# Patient Record
Sex: Female | Born: 1995 | Race: Black or African American | Hispanic: No | Marital: Single | Smoking: Never smoker
Health system: Southern US, Community
[De-identification: ages and names within clinical notes are randomized; demographics above are authoritative.]

## PROBLEM LIST (undated history)

## (undated) ENCOUNTER — Inpatient Hospital Stay (HOSPITAL_COMMUNITY): Payer: Self-pay

## (undated) DIAGNOSIS — O24419 Gestational diabetes mellitus in pregnancy, unspecified control: Secondary | ICD-10-CM

## (undated) DIAGNOSIS — E559 Vitamin D deficiency, unspecified: Secondary | ICD-10-CM

## (undated) DIAGNOSIS — E785 Hyperlipidemia, unspecified: Secondary | ICD-10-CM

## (undated) DIAGNOSIS — D649 Anemia, unspecified: Secondary | ICD-10-CM

## (undated) HISTORY — PX: WISDOM TOOTH EXTRACTION: SHX21

## (undated) HISTORY — DX: Vitamin D deficiency, unspecified: E55.9

## (undated) HISTORY — DX: Gestational diabetes mellitus in pregnancy, unspecified control: O24.419

## (undated) HISTORY — DX: Hyperlipidemia, unspecified: E78.5

---
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
## 2016-01-13 ENCOUNTER — Ambulatory Visit (INDEPENDENT_AMBULATORY_CARE_PROVIDER_SITE_OTHER): Payer: Medicaid Other | Admitting: Family

## 2016-01-13 ENCOUNTER — Encounter: Payer: Self-pay | Admitting: Family

## 2016-01-13 VITALS — BP 125/67 | HR 93 | Wt 190.0 lb

## 2016-01-13 DIAGNOSIS — Z23 Encounter for immunization: Secondary | ICD-10-CM | POA: Diagnosis present

## 2016-01-13 DIAGNOSIS — R8271 Bacteriuria: Secondary | ICD-10-CM | POA: Insufficient documentation

## 2016-01-13 DIAGNOSIS — Z3493 Encounter for supervision of normal pregnancy, unspecified, third trimester: Secondary | ICD-10-CM | POA: Diagnosis not present

## 2016-01-13 DIAGNOSIS — Z3490 Encounter for supervision of normal pregnancy, unspecified, unspecified trimester: Secondary | ICD-10-CM | POA: Insufficient documentation

## 2016-01-13 LAB — POCT URINALYSIS DIP (DEVICE)
BILIRUBIN URINE: NEGATIVE
Glucose, UA: NEGATIVE mg/dL
KETONES UR: NEGATIVE mg/dL
Nitrite: NEGATIVE
Protein, ur: NEGATIVE mg/dL
SPECIFIC GRAVITY, URINE: 1.015 (ref 1.005–1.030)
Urobilinogen, UA: 0.2 mg/dL (ref 0.0–1.0)
pH: 7 (ref 5.0–8.0)

## 2016-01-13 NOTE — Progress Notes (Signed)
Flu vaccine given Urine: trace hgb, wbcs

## 2016-01-13 NOTE — Patient Instructions (Signed)

## 2016-01-13 NOTE — Progress Notes (Signed)
   PRENATAL VISIT NOTE  Subjective:  Christophe LouisJihan Knudtson is a 20 y.o. G1P0 at 7967w0d being seen today for ongoing prenatal care.  She is currently monitored for the following issues for this low-risk pregnancy and has Pain in joint, ankle and foot; Nevus, non-neoplastic; Supervision of normal pregnancy, antepartum; Supervision of normal pregnancy; and Bacteria in urine on her problem list.  Patient reports no complaints.  Contractions: Not present. Vag. Bleeding: None.  Movement: Present. Denies leaking of fluid.   The following portions of the patient's history were reviewed and updated as appropriate: allergies, current medications, past family history, past medical history, past social history, past surgical history and problem list. Problem list updated.  Objective:   Vitals:   01/13/16 1122  BP: 125/67  Pulse: 93  Weight: 190 lb (86.2 kg)    Fetal Status: Fetal Heart Rate (bpm): 148 Fundal Height: 23 cm Movement: Present     General:  Alert, oriented and cooperative. Patient is in no acute distress.  Skin: Skin is warm and dry. No rash noted.   Cardiovascular: Normal heart rate noted  Respiratory: Normal respiratory effort, no problems with respiration noted  Abdomen: Soft, gravid, appropriate for gestational age. Pain/Pressure: Present     Pelvic:  Cervical exam deferred        Extremities: Normal range of motion.  Edema: None  Mental Status: Normal mood and affect. Normal behavior. Normal judgment and thought content.   Urinalysis: Urine Protein: Negative Urine Glucose: Negative  Assessment and Plan:  Pregnancy: G1P0 at 1967w0d  1. Needs flu shot - Flu Vaccine QUAD 36+ mos IM (Fluarix, Quad PF)  2. Supervision of normal pregnancy, third trimester - Discussed ultrasound frequency (as clinically indicated)  3. Bacteria in urine - Culture, OB Urine  Preterm labor symptoms and general obstetric precautions including but not limited to vaginal bleeding, contractions, leaking of  fluid and fetal movement were reviewed in detail with the patient. Please refer to After Visit Summary for other counseling recommendations.  Return in 4 weeks (on 02/10/2016).  Eino FarberWalidah Kennith GainN Karim, CNM

## 2016-01-16 NOTE — Progress Notes (Deleted)
  [name] D.O. [facility] [address] [place], [place] [address] Phone: [phone] Subjective:    I'm seeing this patient by the request  of:  ETTEFAGH, KATE S, MD   CC: Shoulder pain  OZH:[id number]:Subjective  Sally Shannon is a 20 y.o. female coming in with complaint of shoulder pain. Patient's past medical history significant for pregnancy. Patient is 21 weeks and 5 days.     Past Medical History:  Diagnosis Date  . Anemia    No past surgical history on file. Social History   Social History  . Marital status: Single    Spouse name: N/A  . Number of children: N/A  . Years of education: N/A   Social History Main Topics  . Smoking status: Never Smoker  . Smokeless tobacco: Never Used  . Alcohol use No  . Drug use: No  . Sexual activity: Yes   Other Topics Concern  . Not on file   Social History Narrative  . No narrative on file   No Known Allergies Family History  Problem Relation Age of Onset  . Anemia Mother   . Depression Mother   . Diabetes Mother   . Anemia Maternal Grandmother     Past medical history, social, surgical and family history all reviewed in electronic medical record.  No pertanent information unless stated regarding to the chief complaint.   Review of Systems: No headache, visual changes, nausea, vomiting, diarrhea, constipation, dizziness, abdominal pain, skin rash, fevers, chills, night sweats, weight loss, swollen lymph nodes, body aches, joint swelling, muscle aches, chest pain, shortness of breath, mood changes.   Objective  Last menstrual period 08/19/2015.  General: No apparent distress alert and oriented x3 mood and affect normal, dressed appropriately.  HEENT: Pupils equal, extraocular movements intact  Respiratory: Patient's speak in full sentences and does not appear short of breath  Cardiovascular: No lower extremity edema, non tender, no erythema  Skin: Warm dry intact with no signs of infection or rash on extremities  or on axial skeleton.  Abdomen: Soft nontender  Neuro: Cranial nerves II through XII are intact, neurovascularly intact in all extremities with 2+ DTRs and 2+ pulses.  Lymph: No lymphadenopathy of posterior or anterior cervical chain or axillae bilaterally.  Gait normal with good balance and coordination.  MSK:  Non tender with full range of motion and good stability and symmetric strength and tone of shoulders, elbows, wrist, hip, knee and ankles bilaterally.     Impression and Recommendations:     This case required medical decision making of moderate complexity.      Note: This dictation was prepared with Dragon dictation along with smaller phrase technology. Any transcriptional errors that result from this process are unintentional.

## 2016-01-18 ENCOUNTER — Ambulatory Visit: Payer: Medicaid Other | Admitting: Family Medicine

## 2016-02-03 ENCOUNTER — Telehealth: Payer: Self-pay | Admitting: *Deleted

## 2016-02-03 NOTE — Telephone Encounter (Signed)
Patient called, left message. States rib pain on right side. It is very bad, what should she do, is this normal? [redacted] weeks pregnant. Please return her call

## 2016-02-05 NOTE — Telephone Encounter (Signed)
Attempted to call patient. There was no answer, phone rang several times, then stopped. No voice mail available.

## 2016-02-10 ENCOUNTER — Encounter: Payer: Self-pay | Admitting: Advanced Practice Midwife

## 2016-02-10 ENCOUNTER — Ambulatory Visit (INDEPENDENT_AMBULATORY_CARE_PROVIDER_SITE_OTHER): Payer: Medicaid Other | Admitting: Advanced Practice Midwife

## 2016-02-10 VITALS — BP 127/67 | HR 105 | Wt 196.6 lb

## 2016-02-10 DIAGNOSIS — N898 Other specified noninflammatory disorders of vagina: Secondary | ICD-10-CM

## 2016-02-10 DIAGNOSIS — B373 Candidiasis of vulva and vagina: Secondary | ICD-10-CM

## 2016-02-10 DIAGNOSIS — O26892 Other specified pregnancy related conditions, second trimester: Principal | ICD-10-CM

## 2016-02-10 DIAGNOSIS — B3731 Acute candidiasis of vulva and vagina: Secondary | ICD-10-CM | POA: Insufficient documentation

## 2016-02-10 DIAGNOSIS — O98812 Other maternal infectious and parasitic diseases complicating pregnancy, second trimester: Secondary | ICD-10-CM

## 2016-02-10 MED ORDER — TERCONAZOLE 0.4 % VA CREA: 1.0000 | TOPICAL_CREAM | Freq: Every day | VAGINAL | 0 refills | Status: DC

## 2016-02-10 NOTE — Progress Notes (Signed)
C/o vaginal itching, burning, pain with intercourse.

## 2016-02-10 NOTE — Patient Instructions (Signed)

## 2016-02-10 NOTE — Progress Notes (Signed)
   PRENATAL VISIT NOTE  Subjective:  Sally Shannon is a 20 y.o. G1P0 at 4110w0d being seen today for ongoing prenatal care.  She is currently monitored for the following issues for this low-risk pregnancy and has Pain in joint, ankle and foot; Nevus, non-neoplastic; Supervision of normal pregnancy, antepartum; Supervision of normal pregnancy; Bacteria in urine; and Yeast vaginitis on her problem list.  Patient reports vaginal irritation.  Contractions: Not present. Vag. Bleeding: None.  Movement: Present. Denies leaking of fluid.   The following portions of the patient's history were reviewed and updated as appropriate: allergies, current medications, past family history, past medical history, past social history, past surgical history and problem list. Problem list updated.  Objective:   Vitals:   02/10/16 1550  BP: 127/67  Pulse: (!) 105  Weight: 196 lb 9.6 oz (89.2 kg)    Fetal Status: Fetal Heart Rate (bpm): 155   Movement: Present     General:  Alert, oriented and cooperative. Patient is in no acute distress.  Skin: Skin is warm and dry. No rash noted.   Cardiovascular: Normal heart rate noted  Respiratory: Normal respiratory effort, no problems with respiration noted  Abdomen: Soft, gravid, appropriate for gestational age. Pain/Pressure: Absent     Pelvic:  Cervical exam deferred        Extremities: Normal range of motion.  Edema: None  Mental Status: Normal mood and affect. Normal behavior. Normal judgment and thought content.   Urinalysis:      Assessment and Plan:  Pregnancy: G1P0 at 510w0d  1. Vaginal discharge during pregnancy in second trimester       Will Rx Terazol 7  - Wet prep, genital  2. Yeast vaginitis    Preterm labor symptoms and general obstetric precautions including but not limited to vaginal bleeding, contractions, leaking of fluid and fetal movement were reviewed in detail with the patient. Please refer to After Visit Summary for other counseling  recommendations.  Discussed can bring 18 large Brachs jelly beans for glucola Return in about 3 weeks (around 03/02/2016) for [facility].  Aviva SignsMarie L Story Vanvranken, CNM

## 2016-02-11 LAB — WET PREP, GENITAL
TRICH WET PREP: NONE SEEN
Yeast Wet Prep HPF POC: NONE SEEN

## 2016-02-12 NOTE — Telephone Encounter (Signed)
Patient was seen 10/11

## 2016-03-01 ENCOUNTER — Inpatient Hospital Stay (HOSPITAL_COMMUNITY)
Admission: AD | Admit: 2016-03-01 | Discharge: 2016-03-01 | Disposition: A | Discharge status: Home / Self Care | Payer: Medicaid Other | Source: Ambulatory Visit | Attending: Family Medicine | Admitting: Family Medicine

## 2016-03-01 ENCOUNTER — Encounter (HOSPITAL_COMMUNITY): Payer: Self-pay

## 2016-03-01 DIAGNOSIS — R1084 Generalized abdominal pain: Secondary | ICD-10-CM | POA: Diagnosis not present

## 2016-03-01 DIAGNOSIS — R109 Unspecified abdominal pain: Secondary | ICD-10-CM | POA: Insufficient documentation

## 2016-03-01 DIAGNOSIS — O26892 Other specified pregnancy related conditions, second trimester: Secondary | ICD-10-CM | POA: Insufficient documentation

## 2016-03-01 DIAGNOSIS — R140 Abdominal distension (gaseous): Secondary | ICD-10-CM | POA: Insufficient documentation

## 2016-03-01 DIAGNOSIS — O9989 Other specified diseases and conditions complicating pregnancy, childbirth and the puerperium: Secondary | ICD-10-CM

## 2016-03-01 DIAGNOSIS — Z3A27 27 weeks gestation of pregnancy: Secondary | ICD-10-CM | POA: Diagnosis not present

## 2016-03-01 MED ORDER — SIMETHICONE 80 MG PO CHEW
80.0000 mg | CHEWABLE_TABLET | Freq: Once | ORAL | Status: AC
  Administered 2016-03-01: 80 mg via ORAL
  Filled 2016-03-01: qty 1

## 2016-03-01 NOTE — MAU Provider Note (Signed)
Chief Complaint:  Abdominal Cramping   First Provider Initiated Contact with Patient 03/01/16 1810     HPI: Sally Shannon is a 20 y.o. G1P0 at 3127w6dwho presents to maternity admissions reporting abdominal cramping for the past hour.  Sudden onset with vomiting. Sharp, upper abdomen to umbilicus.  Doesn't feel it as much right now. She reports good fetal movement, denies LOF, vaginal bleeding, vaginal itching/burning, urinary symptoms, h/a, dizziness, n/v, diarrhea, constipation or fever/chills.    Abdominal Pain  This is a new problem. The current episode started today. The onset quality is sudden. The problem occurs intermittently. The problem has been gradually improving. The pain is located in the generalized abdominal region, periumbilical region and epigastric region. The pain is moderate. The quality of the pain is sharp and cramping. The abdominal pain does not radiate. Associated symptoms include vomiting. Pertinent negatives include no anorexia, constipation, diarrhea, fever, headaches, myalgias or nausea. Nothing aggravates the pain. The pain is relieved by nothing. She has tried nothing for the symptoms.   RN Note: Pt presents to MAU with cramping that began about an hour ago. Had a sudden onset while using the bathroom and vomited. Has not vomited since, still cramping. Denies any abnormal discharge, odor or urinary symptoms. Denies bleeding or leaking of fluid.     Electronically signed by Rich BraveLindsey E Seymour, RN at 03/01/2016 5:37 PM      Past Medical History: Past Medical History:  Diagnosis Date  . Anemia     Past obstetric history: OB History  Gravida Para Term Preterm AB Living  1            SAB TAB Ectopic Multiple Live Births               # Outcome Date GA Lbr Len/2nd Weight Sex Delivery Anes PTL Lv  1 Current               Past Surgical History: Past Surgical History:  Procedure Laterality Date  . WISDOM TOOTH EXTRACTION      Family History: Family History   Problem Relation Age of Onset  . Anemia Mother   . Depression Mother   . Diabetes Mother   . Anemia Maternal Grandmother     Social History: Social History  Substance Use Topics  . Smoking status: Never Smoker  . Smokeless tobacco: Never Used  . Alcohol use No    Allergies: No Known Allergies  Meds:  Prescriptions Prior to Admission  Medication Sig Dispense Refill Last Dose  . Prenat-Fe Bisgly-FA-w/o Vit A (COMPLETE PRENATAL) 30-0.975 MG TABS Take 1 tablet by mouth daily with breakfast. 90 tablet 3 Taking  . ranitidine (ZANTAC) 150 MG tablet Take 150 mg by mouth daily as needed for heartburn.   Taking  . terconazole (TERAZOL 7) 0.4 % vaginal cream Place 1 applicator vaginally at bedtime. 45 g 0     I have reviewed patient's Past Medical Hx, Surgical Hx, Family Hx, Social Hx, medications and allergies.   ROS:  Review of Systems  Constitutional: Negative for fever.  Gastrointestinal: Positive for abdominal pain and vomiting. Negative for anorexia, constipation, diarrhea and nausea.  Musculoskeletal: Negative for myalgias.  Neurological: Negative for headaches.   Other systems negative  Physical Exam  Patient Vitals for the past 24 hrs:  BP Temp Temp src Pulse Resp Height Weight  03/01/16 1731 114/75 99.4 F (37.4 C) Oral 104 18 5\' 4"  (1.626 m) 190 lb (86.2 kg)   Constitutional: Well-developed, well-nourished female  in no acute distress.  Cardiovascular: normal rate and rhythm Respiratory: normal effort, clear to auscultation bilaterally GI: Abd soft, mildly tender on mid to upper abodmen, gravid appropriate for gestational age.   No rebound or guarding. MS: Extremities nontender, no edema, normal ROM Neurologic: Alert and oriented x 4.  GU: Neg CVAT.  PELVIC EXAM: Cervix long and closed   FHT:  Baseline 145 , moderate variability, small accelerations present, no decelerations Contractions:  Rare   Labs: No results found for this or any previous visit (from the  past 72 hour(s)).  Imaging:  No results found.  MAU Course/MDM:  NST reviewed History and exam indicate possible GI source of pain. There are no contractions or cervical change to indicate preterm contractions.  Will try simethicone Treatments in MAU included simethicone, which produced improvement in symptoms Discussed possible GI upset as source of pain. Reviewed preterm labor symptoms in detail.    Assessment: Functional bloating  Generalized abdominal pain - No evidence of preterm labor   Plan: Discharge home Rx Simethicone for PRN use at home Preterm Labor precautions and fetal kick counts Follow up in Office for prenatal visits and recheck of status  Encouraged to return here or to other Urgent Care/ED if she develops worsening of symptoms, increase in pain, fever, or other concerning symptoms.   Pt stable at time of discharge.  Wynelle BourgeoisMarie Zoey Bidwell CNM, MSN Certified Nurse-Midwife 03/01/2016 6:10 PM

## 2016-03-01 NOTE — MAU Note (Signed)
Pt presents to MAU with cramping that began about an hour ago. Had a sudden onset while using the bathroom and vomited. Has not vomited since, still cramping. Denies any abnormal discharge, odor or urinary symptoms. Denies bleeding or leaking of fluid.

## 2016-03-01 NOTE — Discharge Instructions (Signed)

## 2016-03-01 NOTE — MAU Note (Signed)
Urine in lab 

## 2016-03-02 ENCOUNTER — Ambulatory Visit (INDEPENDENT_AMBULATORY_CARE_PROVIDER_SITE_OTHER): Payer: Medicaid Other | Admitting: Obstetrics and Gynecology

## 2016-03-02 VITALS — BP 112/58 | HR 109 | Wt 201.8 lb

## 2016-03-02 DIAGNOSIS — Z3400 Encounter for supervision of normal first pregnancy, unspecified trimester: Secondary | ICD-10-CM

## 2016-03-02 DIAGNOSIS — Z3403 Encounter for supervision of normal first pregnancy, third trimester: Secondary | ICD-10-CM

## 2016-03-02 MED ORDER — PRENATAL VITAMINS 28-0.8 MG PO TABS: 1.0000 | ORAL_TABLET | Freq: Every day | ORAL | 11 refills | Status: DC

## 2016-03-02 NOTE — Patient Instructions (Signed)
Levonorgestrel intrauterine device (IUD) What is this medicine? LEVONORGESTREL IUD (LEE voe nor jes trel) is a contraceptive (birth control) device. The device is placed inside the uterus by a healthcare professional. It is used to prevent pregnancy and can also be used to treat heavy bleeding that occurs during your period. Depending on the device, it can be used for 3 to 5 years. This medicine may be used for other purposes; ask your health care provider or pharmacist if you have questions. What should I tell my health care provider before I take this medicine? They need to know if you have any of these conditions: -abnormal Pap smear -cancer of the breast, uterus, or cervix -diabetes -endometritis -genital or pelvic infection now or in the past -have more than one sexual partner or your partner has more than one partner -heart disease -history of an ectopic or tubal pregnancy -immune system problems -IUD in place -liver disease or tumor -problems with blood clots or take blood-thinners -use intravenous drugs -uterus of unusual shape -vaginal bleeding that has not been explained -an unusual or allergic reaction to levonorgestrel, other hormones, silicone, or polyethylene, medicines, foods, dyes, or preservatives -pregnant or trying to get pregnant -breast-feeding How should I use this medicine? This device is placed inside the uterus by a health care professional. Talk to your pediatrician regarding the use of this medicine in children. Special care may be needed. Overdosage: If you think you have taken too much of this medicine contact a poison control center or emergency room at once. NOTE: This medicine is only for you. Do not share this medicine with others. What if I miss a dose? This does not apply. What may interact with this medicine? Do not take this medicine with any of the following medications: -amprenavir -bosentan -fosamprenavir This medicine may also interact with  the following medications: -aprepitant -barbiturate medicines for inducing sleep or treating seizures -bexarotene -griseofulvin -medicines to treat seizures like carbamazepine, ethotoin, felbamate, oxcarbazepine, phenytoin, topiramate -modafinil -pioglitazone -rifabutin -rifampin -rifapentine -some medicines to treat HIV infection like atazanavir, indinavir, lopinavir, nelfinavir, tipranavir, ritonavir -St. John's wort -warfarin This list may not describe all possible interactions. Give your health care provider a list of all the medicines, herbs, non-prescription drugs, or dietary supplements you use. Also tell them if you smoke, drink alcohol, or use illegal drugs. Some items may interact with your medicine. What should I watch for while using this medicine? Visit your doctor or health care professional for regular check ups. See your doctor if you or your partner has sexual contact with others, becomes HIV positive, or gets a sexual transmitted disease. This product does not protect you against HIV infection (AIDS) or other sexually transmitted diseases. You can check the placement of the IUD yourself by reaching up to the top of your vagina with clean fingers to feel the threads. Do not pull on the threads. It is a good habit to check placement after each menstrual period. Call your doctor right away if you feel more of the IUD than just the threads or if you cannot feel the threads at all. The IUD may come out by itself. You may become pregnant if the device comes out. If you notice that the IUD has come out use a backup birth control method like condoms and call your health care provider. Using tampons will not change the position of the IUD and are okay to use during your period. What side effects may I notice from receiving this medicine?   Side effects that you should report to your doctor or health care professional as soon as possible: -allergic reactions like skin rash, itching or  hives, swelling of the face, lips, or tongue -fever, flu-like symptoms -genital sores -high blood pressure -no menstrual period for 6 weeks during use -pain, swelling, warmth in the leg -pelvic pain or tenderness -severe or sudden headache -signs of pregnancy -stomach cramping -sudden shortness of breath -trouble with balance, talking, or walking -unusual vaginal bleeding, discharge -yellowing of the eyes or skin Side effects that usually do not require medical attention (report to your doctor or health care professional if they continue or are bothersome): -acne -breast pain -change in sex drive or performance -changes in weight -cramping, dizziness, or faintness while the device is being inserted -headache -irregular menstrual bleeding within first 3 to 6 months of use -nausea This list may not describe all possible side effects. Call your doctor for medical advice about side effects. You may report side effects to FDA at 1-800-FDA-1088. Where should I keep my medicine? This does not apply. NOTE: This sheet is a summary. It may not cover all possible information. If you have questions about this medicine, talk to your doctor, pharmacist, or health care provider.    2016, Elsevier/Gold Standard. (2011-05-19 13:54:04) Intrauterine Device Information An intrauterine device (IUD) is inserted into your uterus to prevent pregnancy. There are two types of IUDs available:   Copper IUD--This type of IUD is wrapped in copper wire and is placed inside the uterus. Copper makes the uterus and fallopian tubes produce a fluid that kills sperm. The copper IUD can stay in place for 10 years.  Hormone IUD--This type of IUD contains the hormone progestin (synthetic progesterone). The hormone thickens the cervical mucus and prevents sperm from entering the uterus. It also thins the uterine lining to prevent implantation of a fertilized egg. The hormone can weaken or kill the sperm that get into the  uterus. One type of hormone IUD can stay in place for 5 years, and another type can stay in place for 3 years. Your health care provider will make sure you are a good candidate for a contraceptive IUD. Discuss with your health care provider the possible side effects.  ADVANTAGES OF AN INTRAUTERINE DEVICE  IUDs are highly effective, reversible, long acting, and low maintenance.   There are no estrogen-related side effects.   An IUD can be used when breastfeeding.   IUDs are not associated with weight gain.   The copper IUD works immediately after insertion.   The hormone IUD works right away if inserted within 7 days of your period starting. You will need to use a backup method of birth control for 7 days if the hormone IUD is inserted at any other time in your cycle.  The copper IUD does not interfere with your female hormones.   The hormone IUD can make heavy menstrual periods lighter and decrease cramping.   The hormone IUD can be used for 3 or 5 years.   The copper IUD can be used for 10 years. DISADVANTAGES OF AN INTRAUTERINE DEVICE  The hormone IUD can be associated with irregular bleeding patterns.   The copper IUD can make your menstrual flow heavier and more painful.   You may experience cramping and vaginal bleeding after insertion.    This information is not intended to replace advice given to you by your health care provider. Make sure you discuss any questions you have with your health care  provider.   Document Released: 03/22/2004 Document Revised: 12/19/2012 Document Reviewed: 10/07/2012 Elsevier Interactive Patient Education [employer]2016 Elsevier Inc.

## 2016-03-02 NOTE — Progress Notes (Signed)
   PRENATAL VISIT NOTE  Subjective:  Sally Shannon is a 20 y.o. G1P0 at 2639w0d being seen today for ongoing prenatal care.  She is currently monitored for the following issues for this low-risk pregnancy and has Pain in joint, ankle and foot; Nevus, non-neoplastic; Supervision of normal pregnancy, antepartum; Supervision of normal pregnancy; Bacteria in urine; and Yeast vaginitis on her problem list.  Patient reports no complaints.  Contractions: Irregular. Vag. Bleeding: None.  Movement: Present. Denies leaking of fluid. Had GI upset a few days ago; feeling better now.   The following portions of the patient's history were reviewed and updated as appropriate: allergies, current medications, past family history, past medical history, past social history, past surgical history and problem list. Problem list updated.  Objective:   Vitals:   03/02/16 0955  BP: (!) 112/58  Pulse: (!) 109  Weight: 201 lb 12.8 oz (91.5 kg)    Fetal Status: Fetal Heart Rate (bpm): 155   Movement: Present     General:  Alert, oriented and cooperative. Patient is in no acute distress.  Skin: Skin is warm and dry. No rash noted.   Cardiovascular: Normal heart rate noted  Respiratory: Normal respiratory effort, no problems with respiration noted  Abdomen: Soft, gravid, appropriate for gestational age. Pain/Pressure: Present     Pelvic:  Cervical exam deferred        Extremities: Normal range of motion.  Edema: Trace  Mental Status: Normal mood and affect. Normal behavior. Normal judgment and thought content.   Assessment and Plan:  Pregnancy: G1P0 at 6439w0d  1. Encounter for supervision of normal first pregnancy in third trimester - patient declined glucose test due to recent Gi upset. Will schedule to come back in the next week to have this done.  - Patient declines Tdap today, would like to get this at her next visit.  - Prenatal Vit-Fe Fumarate-FA (PRENATAL VITAMINS) 28-0.8 MG TABS; Take 1 tablet by mouth  daily.  Dispense: 30 tablet; Refill: 11  Preterm labor symptoms and general obstetric precautions including but not limited to vaginal bleeding, contractions, leaking of fluid and fetal movement were reviewed in detail with the patient. Please refer to After Visit Summary for other counseling recommendations.  Return in about 1 week (around 03/09/2016) for For 2 hour Gtt.  Duane LopeJennifer I Neftaly Swiss, NP

## 2016-03-02 NOTE — Progress Notes (Signed)
States went to [facility] yesterday for severe cramping and vomiting. States is better today. States has had diarrhea today.  Would like to do 2 hour GTT another day. Would like to do tdap next visit.

## 2016-03-09 ENCOUNTER — Other Ambulatory Visit (INDEPENDENT_AMBULATORY_CARE_PROVIDER_SITE_OTHER): Payer: Medicaid Other

## 2016-03-09 DIAGNOSIS — Z3403 Encounter for supervision of normal first pregnancy, third trimester: Secondary | ICD-10-CM

## 2016-03-09 DIAGNOSIS — Z23 Encounter for immunization: Secondary | ICD-10-CM

## 2016-03-09 LAB — CBC
HCT: 36.1 % (ref 34.0–46.0)
Hemoglobin: 12.1 g/dL (ref 11.5–15.3)
MCH: 27.9 pg (ref 25.0–35.0)
MCHC: 33.5 g/dL (ref 31.0–36.0)
MCV: 83.2 fL (ref 78.0–98.0)
MPV: 9.3 fL (ref 7.5–12.5)
PLATELETS: 259 10*3/uL (ref 140–400)
RBC: 4.34 MIL/uL (ref 3.80–5.10)
RDW: 14.4 % (ref 11.0–15.0)
WBC: 7.5 10*3/uL (ref 4.5–13.0)

## 2016-03-09 MED ORDER — TETANUS-DIPHTH-ACELL PERTUSSIS 5-2.5-18.5 LF-MCG/0.5 IM SUSP
0.5000 mL | Freq: Once | INTRAMUSCULAR | Status: AC
  Administered 2016-03-09: 0.5 mL via INTRAMUSCULAR

## 2016-03-10 LAB — HIV ANTIBODY (ROUTINE TESTING W REFLEX): HIV 1&2 Ab, 4th Generation: NONREACTIVE

## 2016-03-10 LAB — GLUCOSE TOLERANCE, 2 HOURS W/ 1HR
GLUCOSE, FASTING: 93 mg/dL (ref 65–99)
GLUCOSE: 192 mg/dL
Glucose, 2 hour: 134 mg/dL (ref <140)

## 2016-03-10 LAB — RPR

## 2016-03-11 ENCOUNTER — Encounter: Payer: Self-pay | Admitting: Family

## 2016-03-11 DIAGNOSIS — O24419 Gestational diabetes mellitus in pregnancy, unspecified control: Secondary | ICD-10-CM | POA: Insufficient documentation

## 2016-03-14 ENCOUNTER — Telehealth: Payer: Self-pay | Admitting: General Practice

## 2016-03-14 DIAGNOSIS — O24419 Gestational diabetes mellitus in pregnancy, unspecified control: Secondary | ICD-10-CM

## 2016-03-14 MED ORDER — ACCU-CHEK AVIVA PLUS W/DEVICE KIT: 1.0000 | PACK | Freq: Once | 0 refills | Status: AC

## 2016-03-14 MED ORDER — GLUCOSE BLOOD VI STRP: ORAL_STRIP | 12 refills | Status: DC

## 2016-03-14 MED ORDER — ACCU-CHEK SOFTCLIX LANCET DEV MISC: 11 refills | Status: DC

## 2016-03-14 NOTE — Telephone Encounter (Signed)
Per NWGNFAOWalidah, patient has GDM and needs referral for diabetes education. Scheduled patient here at our office for next Monday 11/20 @ 10am as that is the next available. Ordered testing supplies. Called patient & informed her of results & supplies sent to pharmacy. Patient verbalized understanding to all & asked if she still needed her appt next Tuesday 11/21. Told patient I will look into that and let her know via mychart. Patient had no other questions. Patient does need to keep both appts per Felicity CoyerKelly R.

## 2016-03-21 ENCOUNTER — Other Ambulatory Visit: Payer: Medicaid Other

## 2016-03-21 LAB — GLUCOSE TOLERANCE, 2 HOURS

## 2016-03-22 ENCOUNTER — Ambulatory Visit (INDEPENDENT_AMBULATORY_CARE_PROVIDER_SITE_OTHER): Payer: Medicaid Other | Admitting: Advanced Practice Midwife

## 2016-03-22 VITALS — BP 113/59 | HR 108 | Temp 98.2°F | Wt 207.4 lb

## 2016-03-22 DIAGNOSIS — O99513 Diseases of the respiratory system complicating pregnancy, third trimester: Secondary | ICD-10-CM

## 2016-03-22 DIAGNOSIS — O24419 Gestational diabetes mellitus in pregnancy, unspecified control: Secondary | ICD-10-CM

## 2016-03-22 DIAGNOSIS — O0990 Supervision of high risk pregnancy, unspecified, unspecified trimester: Secondary | ICD-10-CM

## 2016-03-22 DIAGNOSIS — O24919 Unspecified diabetes mellitus in pregnancy, unspecified trimester: Secondary | ICD-10-CM

## 2016-03-22 DIAGNOSIS — J069 Acute upper respiratory infection, unspecified: Secondary | ICD-10-CM

## 2016-03-22 LAB — GLUCOSE, CAPILLARY: GLUCOSE-CAPILLARY: 73 mg/dL (ref 65–99)

## 2016-03-22 MED ORDER — PSEUDOEPHEDRINE HCL 30 MG PO TABS: 30.0000 mg | ORAL_TABLET | ORAL | 1 refills | Status: DC | PRN

## 2016-03-22 MED ORDER — BENZONATATE 100 MG PO CAPS: 200.0000 mg | ORAL_CAPSULE | Freq: Three times a day (TID) | ORAL | 0 refills | Status: DC | PRN

## 2016-03-22 MED ORDER — CONCEPT OB 130-92.4-1 MG PO CAPS: 1.0000 | ORAL_CAPSULE | Freq: Every day | ORAL | 12 refills | Status: DC

## 2016-03-22 NOTE — Patient Instructions (Signed)
Carbohydrate Counting for Diabetes Mellitus, Adult Carbohydrate counting is a method for keeping track of how many carbohydrates you eat. Eating carbohydrates naturally increases the amount of sugar (glucose) in the blood. Counting how many carbohydrates you eat helps keep your blood glucose within normal limits, which helps you manage your diabetes (diabetes mellitus). It is important to know how many carbohydrates you can safely have in each meal. This is different for every person. A diet and nutrition specialist (registered dietitian) can help you make a meal plan and calculate how many carbohydrates you should have at each meal and snack. Carbohydrates are found in the following foods:  Grains, such as breads and cereals.  Dried beans and soy products.  Starchy vegetables, such as potatoes, peas, and corn.  Fruit and fruit juices.  Milk and yogurt.  Sweets and snack foods, such as cake, cookies, candy, chips, and soft drinks. How do I count carbohydrates? There are two ways to count carbohydrates in food. You can use either of the methods or a combination of both. Reading "Nutrition Facts" on packaged food  The "Nutrition Facts" list is included on the labels of almost all packaged foods and beverages in the [place] It includes:  The serving size.  Information about nutrients in each serving, including the grams (g) of carbohydrate per serving. To use the "Nutrition Facts":  Decide how many servings you will have.  Multiply the number of servings by the number of carbohydrates per serving.  The resulting number is the total amount of carbohydrates that you will be having. Learning standard serving sizes of other foods  When you eat foods containing carbohydrates that are not packaged or do not include "Nutrition Facts" on the label, you need to measure the servings in order to count the amount of carbohydrates:  Measure the foods that you will eat with a food scale or measuring  cup, if needed.  Decide how many standard-size servings you will eat.  Multiply the number of servings by 15. Most carbohydrate-rich foods have about 15 g of carbohydrates per serving.  For example, if you eat 8 oz (170 g) of strawberries, you will have eaten 2 servings and 30 g of carbohydrates (2 servings x 15 g = 30 g).  For foods that have more than one food mixed, such as soups and casseroles, you must count the carbohydrates in each food that is included. The following list contains standard serving sizes of common carbohydrate-rich foods. Each of these servings has about 15 g of carbohydrates:   hamburger bun or  English muffin.   oz (15 mL) syrup.   oz (14 g) jelly.  1 slice of bread.  1 six-inch tortilla.  3 oz (85 g) cooked rice or pasta.  4 oz (113 g) cooked dried beans.  4 oz (113 g) starchy vegetable, such as peas, corn, or potatoes.  4 oz (113 g) hot cereal.  4 oz (113 g) mashed potatoes or  of a large baked potato.  4 oz (113 g) canned or frozen fruit.  4 oz (120 mL) fruit juice.  4-6 crackers.  6 chicken nuggets.  6 oz (170 g) unsweetened dry cereal.  6 oz (170 g) plain fat-free yogurt or yogurt sweetened with artificial sweeteners.  8 oz (240 mL) milk.  8 oz (170 g) fresh fruit or one small piece of fruit.  24 oz (680 g) popped popcorn. Example of carbohydrate counting Sample meal  3 oz (85 g) chicken breast.  6 oz (  170 g) brown rice.  4 oz (113 g) corn.  8 oz (240 mL) milk.  8 oz (170 g) strawberries with sugar-free whipped topping. Carbohydrate calculation 1. Identify the foods that contain carbohydrates:  Rice.  Corn.  Milk.  Strawberries. 2. Calculate how many servings you have of each food:  2 servings rice.  1 serving corn.  1 serving milk.  1 serving strawberries. 3. Multiply each number of servings by 15 g:  2 servings rice x 15 g = 30 g.  1 serving corn x 15 g = 15 g.  1 serving milk x 15 g = 15  g.  1 serving strawberries x 15 g = 15 g. 4. Add together all of the amounts to find the total grams of carbohydrates eaten:  30 g + 15 g + 15 g + 15 g = 75 g of carbohydrates total. This information is not intended to replace advice given to you by your health care provider. Make sure you discuss any questions you have with your health care provider. Document Released: 04/18/2005 Document Revised: 11/06/2015 Document Reviewed: 09/30/2015 Elsevier Interactive Patient Education  2017 Elsevier Inc.  Diabetes Mellitus and Food It is important for you to manage your blood sugar (glucose) level. Your blood glucose level can be greatly affected by what you eat. Eating healthier foods in the appropriate amounts throughout the day at about the same time each day will help you control your blood glucose level. It can also help slow or prevent worsening of your diabetes mellitus. Healthy eating may even help you improve the level of your blood pressure and reach or maintain a healthy weight. General recommendations for healthful eating and cooking habits include:  Eating meals and snacks regularly. Avoid going long periods of time without eating to lose weight.  Eating a diet that consists mainly of plant-based foods, such as fruits, vegetables, nuts, legumes, and whole grains.  Using low-heat cooking methods, such as baking, instead of high-heat cooking methods, such as deep frying. Work with your dietitian to make sure you understand how to use the Nutrition Facts information on food labels. How can food affect me? Carbohydrates  Carbohydrates affect your blood glucose level more than any other type of food. Your dietitian will help you determine how many carbohydrates to eat at each meal and teach you how to count carbohydrates. Counting carbohydrates is important to keep your blood glucose at a healthy level, especially if you are using insulin or taking certain medicines for diabetes  mellitus. What foods are not recommended? As you make food choices, it is important to remember that all foods are not the same. Some foods have fewer nutrients per serving than other foods, even though they might have the same number of calories or carbohydrates. It is difficult to get your body what it needs when you eat foods with fewer nutrients. Examples of foods that you should avoid that are high in calories and carbohydrates but low in nutrients include:  Trans fats (most processed foods list trans fats on the Nutrition Facts label).  Regular soda.  Juice.  Candy.  Sweets, such as cake, pie, doughnuts, and cookies.  Fried foods. What foods can I eat? Eat nutrient-rich foods, which will nourish your body and keep you healthy. The food you should eat also will depend on several factors, including:  The calories you need.  The medicines you take.  Your weight.  Your blood glucose level.  Your blood pressure level.  Your  cholesterol level. You should eat a variety of foods, including:  Protein.  Lean cuts of meat.  Proteins low in saturated fats, such as fish, egg whites, and beans. Avoid processed meats.  Fruits and vegetables.  Fruits and vegetables that may help control blood glucose levels, such as apples, mangoes, and yams.  Dairy products.  Choose fat-free or low-fat dairy products, such as milk, yogurt, and cheese.  Grains, bread, pasta, and rice.  Choose whole grain products, such as multigrain bread, whole oats, and brown rice. These foods may help control blood pressure.  Fats.  Foods containing healthful fats, such as nuts, avocado, olive oil, canola oil, and fish. Does everyone with diabetes mellitus have the same meal plan? Because every person with diabetes mellitus is different, there is not one meal plan that works for everyone. It is very important that you meet with a dietitian who will help you create a meal plan that is just right for  you. This information is not intended to replace advice given to you by your health care provider. Make sure you discuss any questions you have with your health care provider. Document Released: 01/13/2005 Document Revised: 09/24/2015 Document Reviewed: 03/15/2013 Elsevier Interactive Patient Education  2017 [employer]

## 2016-03-22 NOTE — Progress Notes (Signed)
   PRENATAL VISIT NOTE  Subjective:  Sally Shannon is a 20 y.o. G1P0 at 6428w6d being seen today for ongoing prenatal care.  She is currently monitored for the following issues for this high-risk pregnancy and has Pain in joint, ankle and foot; Nevus, non-neoplastic; Supervision of high risk pregnancy, antepartum; Bacteria in urine; Yeast vaginitis; and Gestational diabetes mellitus (GDM) affecting pregnancy, antepartum on her problem list.   New Dx GDM. Received Rx for testing supplies, but Pharmacy would not fill Rx for meter due to Medicaid issue. Pt's DM educator visit is scheduled 03/28/16. She knows how to check CBGs due to her mother being diabetic and plans to start testing as soon as she can get her meter.   Patient reports no complaints.  Contractions: Not present. Vag. Bleeding: None.  Movement: Present. Denies leaking of fluid.   The following portions of the patient's history were reviewed and updated as appropriate: allergies, current medications, past family history, past medical history, past social history, past surgical history and problem list. Problem list updated.  Objective:   Vitals:   03/22/16 1045  BP: (!) 113/59  Pulse: (!) 108  Temp: 98.2 F (36.8 C)  Weight: 207 lb 6.4 oz (94.1 kg)   Random CBG 73  Fetal Status: Fetal Heart Rate (bpm): 150   Movement: Present     General:  Alert, oriented and cooperative. Patient is in no acute distress.  Skin: Skin is warm and dry. No rash noted.   Cardiovascular: Normal heart rate noted  Respiratory: Normal respiratory effort, no problems with respiration noted  Abdomen: Soft, gravid, appropriate for gestational age. Pain/Pressure: Present     Pelvic:  Cervical exam deferred        Extremities: Normal range of motion.  Edema: None  Mental Status: Normal mood and affect. Normal behavior. Normal judgment and thought content.   Assessment and Plan:  Pregnancy: G1P0 at 4728w6d  1. GDM  2. Viral URI  - benzonatate  (TESSALON PERLES) 100 MG capsule; Take 2 capsules (200 mg total) by mouth 3 (three) times daily as needed for cough.  Dispense: 30 capsule; Refill: 0 - pseudoephedrine (SUDAFED) 30 MG tablet; Take 1 tablet (30 mg total) by mouth every 4 (four) hours as needed for congestion.  Dispense: 30 tablet; Refill: 1  Diabetes diet handouts discussed and given.  Lengthy discussion about importance of testing blood sugars, bringing log book and keeping appointments. Discussed risks of uncontrolled DM in pregnancy including delayed fetal lung maturity, IUFD and shoulder dystocia possibly resulting brachial plexus palsy, brain damage, intrapartum death and extensive obstetric lacerations. Patient verbalizes understanding.  Called Pharmacy. They stated that they needed prior authorization. No form found so new one faxed. Medicaid called. New Bin #, etc given. RN to contact pharmacy.  Preterm labor symptoms and general obstetric precautions including but not limited to vaginal bleeding, contractions, leaking of fluid and fetal movement were reviewed in detail with the patient. Please refer to After Visit Summary for other counseling recommendations.  F/U 03/28/16  Dorathy KinsmanVirginia Eaden Hettinger, CNM

## 2016-03-23 ENCOUNTER — Telehealth: Payer: Self-pay | Admitting: *Deleted

## 2016-03-23 NOTE — Telephone Encounter (Signed)
Phoned patient pharmacy re: Medicaid not paying for glucometer. Spoke with pharmacist who was able to look at the order and was able to resubmit and Medicaid approved. Called patient to let her know that she can pick up her meter today. Understanding voiced.

## 2016-03-28 ENCOUNTER — Ambulatory Visit: Payer: Medicaid Other | Admitting: *Deleted

## 2016-03-28 ENCOUNTER — Encounter: Payer: Medicaid Other | Attending: Family Medicine | Admitting: Dietician

## 2016-03-28 DIAGNOSIS — Z3A00 Weeks of gestation of pregnancy not specified: Secondary | ICD-10-CM | POA: Diagnosis not present

## 2016-03-28 DIAGNOSIS — O24919 Unspecified diabetes mellitus in pregnancy, unspecified trimester: Secondary | ICD-10-CM

## 2016-03-28 NOTE — Progress Notes (Signed)
Diabetes Education: 03/28/16 Sally Shannon is seen today for her first visit, accompanied by significant other.  She is G1P0 lady at 3441w5d.  Has a strong family hx of type 2 diabetes (mother, maternal aunts).  Plans to breast feed. Completed review of GDM and implications for the health of the mother and baby.  Review of post partum self-care and weight loss for the prevention of onset of type 2 diabetes. Review of factors that affect blood glucose. Review of the need for regular walking/exercise. Recommended 30 minutes daily or 15 -20 minutes following each meal.   Review of normal pregnancy blood glucose goals. Review of blood glucose monitoring procedure, blood glucose goals and testing times.  Has obtained the Medicaid Accu-Check Aviva Plus meter and strips at the pharmacy. Notes she has been practicing, but failed to bring her meter with her today.  She reports having drank a Gatorade prior to clinic.Random glucose at 11:45 was 72 mg/dl.  Instructed to monitor fasting and 2 hr post prandial glucose levels, record and bring glucose log and meter to all clinic appointments. Review of food groups, carb food groups, carb counting, meal planning.  Provided handout "Nutrition, Diabetes and Pregnancy."    Will plan to follow as needed. Sally Rafferty Postlewait, RN, RD, LDN

## 2016-03-29 ENCOUNTER — Telehealth: Payer: Self-pay | Admitting: *Deleted

## 2016-03-29 DIAGNOSIS — O2242 Hemorrhoids in pregnancy, second trimester: Secondary | ICD-10-CM

## 2016-04-01 ENCOUNTER — Telehealth: Payer: Self-pay | Admitting: General Practice

## 2016-04-01 NOTE — Telephone Encounter (Signed)
Patient called and left message stating she called 3 days ago and was told she would be getting a prescription for her hemorrhoids.

## 2016-04-07 ENCOUNTER — Other Ambulatory Visit: Payer: Self-pay

## 2016-04-07 MED ORDER — HYDROCORTISONE ACE-PRAMOXINE 1-1 % RE FOAM: 1.0000 | Freq: Two times a day (BID) | RECTAL | 0 refills | Status: DC

## 2016-04-07 NOTE — Telephone Encounter (Signed)
Patient is requesting medications for hemorrhoids. Per [facility] can call in some proctform for patient.

## 2016-04-07 NOTE — Telephone Encounter (Signed)
Medications has been called into the pharmacy attempted to call patient but there was no answer or voicemail to leave a message.

## 2016-04-11 ENCOUNTER — Ambulatory Visit (INDEPENDENT_AMBULATORY_CARE_PROVIDER_SITE_OTHER): Payer: Medicaid Other | Admitting: Student

## 2016-04-11 VITALS — BP 122/71 | HR 105 | Wt 208.3 lb

## 2016-04-11 DIAGNOSIS — O2243 Hemorrhoids in pregnancy, third trimester: Secondary | ICD-10-CM

## 2016-04-11 DIAGNOSIS — O24913 Unspecified diabetes mellitus in pregnancy, third trimester: Secondary | ICD-10-CM

## 2016-04-11 DIAGNOSIS — O0990 Supervision of high risk pregnancy, unspecified, unspecified trimester: Secondary | ICD-10-CM

## 2016-04-11 DIAGNOSIS — O2240 Hemorrhoids in pregnancy, unspecified trimester: Secondary | ICD-10-CM

## 2016-04-11 DIAGNOSIS — O24419 Gestational diabetes mellitus in pregnancy, unspecified control: Secondary | ICD-10-CM

## 2016-04-11 LAB — POCT URINALYSIS DIP (DEVICE)
Bilirubin Urine: NEGATIVE
GLUCOSE, UA: NEGATIVE mg/dL
Hgb urine dipstick: NEGATIVE
KETONES UR: NEGATIVE mg/dL
NITRITE: NEGATIVE
PH: 7 (ref 5.0–8.0)
PROTEIN: NEGATIVE mg/dL
Specific Gravity, Urine: 1.02 (ref 1.005–1.030)
UROBILINOGEN UA: 1 mg/dL (ref 0.0–1.0)

## 2016-04-11 NOTE — Progress Notes (Signed)
C/o hemorrhoids, had called

## 2016-04-11 NOTE — Progress Notes (Signed)
   PRENATAL VISIT NOTE  Subjective:  Christophe LouisJihan Suchy is a 20 y.o. G1P0 at 4010w5d being seen today for ongoing prenatal care.  She is currently monitored for the following issues for this high-risk pregnancy and has Pain in joint, ankle and foot; Nevus, non-neoplastic; Supervision of high risk pregnancy, antepartum; Bacteria in urine; Yeast vaginitis; Gestational diabetes mellitus (GDM) affecting pregnancy, antepartum; and Hemorrhoids during pregnancy on her problem list.  Patient reports no complaints.  Contractions: Irregular. Vag. Bleeding: None.  Movement: Present. Denies leaking of fluid.   The following portions of the patient's history were reviewed and updated as appropriate: allergies, current medications, past family history, past medical history, past social history, past surgical history and problem list. Problem list updated.  Objective:   Vitals:   04/11/16 1206  BP: 122/71  Pulse: (!) 105  Weight: 208 lb 4.8 oz (94.5 kg)    Fetal Status: Fetal Heart Rate (bpm): 154 Fundal Height: 34 cm Movement: Present     General:  Alert, oriented and cooperative. Patient is in no acute distress.  Skin: Skin is warm and dry. No rash noted.   Cardiovascular: Normal heart rate noted  Respiratory: Normal respiratory effort, no problems with respiration noted  Abdomen: Soft, gravid, appropriate for gestational age. Pain/Pressure: Present     Pelvic:  Cervical exam deferred        Extremities: Normal range of motion.  Edema: None  Mental Status: Normal mood and affect. Normal behavior. Normal judgment and thought content.   Assessment and Plan:  Pregnancy: G1P0 at 4710w5d  1. Supervision of high risk pregnancy, antepartum Patient feels good; no complaints.  TDap today Hemorroids-will pick up her prescription for proctofoam   2. Gestational diabetes mellitus (GDM) affecting pregnancy, antepartum -Patient has been using her meter; FBG in 80-90. Requested that patient bring log and glucometer  at next visit.  -Growth US at 38 weeks   Term labor symptoms and general obstetric precautions including but not limited to vaginal bleeding, contractions, leaking of fluid and fetal movement were reviewed in detail with the patient. Please refer to After Visit Summary for other counseling recommendations.  Return in about 2 weeks (around 04/25/2016), or Ob FUL.    Marylene LandKathryn Lorraine Adasha Boehme, CNM

## 2016-04-14 NOTE — Telephone Encounter (Signed)
Opened in error

## 2016-04-27 ENCOUNTER — Ambulatory Visit (INDEPENDENT_AMBULATORY_CARE_PROVIDER_SITE_OTHER): Payer: Medicaid Other | Admitting: Family

## 2016-04-27 ENCOUNTER — Other Ambulatory Visit (HOSPITAL_COMMUNITY)
Admission: RE | Admit: 2016-04-27 | Discharge: 2016-04-27 | Disposition: A | Discharge status: Home / Self Care | Payer: Medicaid Other | Source: Ambulatory Visit | Attending: Obstetrics and Gynecology | Admitting: Obstetrics and Gynecology

## 2016-04-27 VITALS — BP 132/79 | HR 108 | Wt 211.0 lb

## 2016-04-27 DIAGNOSIS — O0993 Supervision of high risk pregnancy, unspecified, third trimester: Secondary | ICD-10-CM

## 2016-04-27 DIAGNOSIS — O24410 Gestational diabetes mellitus in pregnancy, diet controlled: Secondary | ICD-10-CM

## 2016-04-27 DIAGNOSIS — Z113 Encounter for screening for infections with a predominantly sexual mode of transmission: Secondary | ICD-10-CM | POA: Insufficient documentation

## 2016-04-27 LAB — OB RESULTS CONSOLE GBS: GBS: NEGATIVE

## 2016-04-27 NOTE — Progress Notes (Addendum)
   PRENATAL VISIT NOTE  Subjective:  Sally Shannon is a 20 y.o. G1P0 at 1376w0d being seen today for ongoing prenatal care.  She is currently monitored for the following issues for this high-risk pregnancy and has Pain in joint, ankle and foot; Nevus, non-neoplastic; Supervision of high risk pregnancy in third trimester; Bacteria in urine; Yeast vaginitis; Gestational diabetes mellitus (GDM) affecting pregnancy, antepartum; Hemorrhoids during pregnancy; and GDM (gestational diabetes mellitus), class A1 on her problem list.  Patient reports no complaints.  Pt did not bring log again today.  Reports values wnl.  Last meal 1 hour ago.  Contractions: Irregular. Vag. Bleeding: None.  Movement: Present. Denies leaking of fluid.   The following portions of the patient's history were reviewed and updated as appropriate: allergies, current medications, past family history, past medical history, past social history, past surgical history and problem list. Problem list updated.  Objective:   Vitals:   04/27/16 1111  BP: 132/79  Pulse: (!) 108  Weight: 211 lb (95.7 kg)    Fetal Status: Fetal Heart Rate (bpm): 145   Movement: Present     General:  Alert, oriented and cooperative. Patient is in no acute distress.  Skin: Skin is warm and dry. No rash noted.   Cardiovascular: Normal heart rate noted  Respiratory: Normal respiratory effort, no problems with respiration noted  Abdomen: Soft, gravid, appropriate for gestational age. Pain/Pressure: Present     Pelvic:  Cervical exam performed      closed/thick  Extremities: Normal range of motion.  Edema: None  Mental Status: Normal mood and affect. Normal behavior. Normal judgment and thought content.   Assessment and Plan:  Pregnancy: G1P0 at 5576w0d  1. GDM (gestational diabetes mellitus), class A1 - US MFM OB FOLLOW UP; Future @ 38 wks - Patient will call Lyla SonCarrie today with CBG log results and will send to me - Explained to patient that management  varies depending on CBG results (may begin fetal testing and IOL at 39 wks)  2. Supervision of high risk pregnancy in third trimester - Culture, beta strep (group b only) - GC[facility]Chlamydia probe amp [facility]  Preterm labor symptoms and general obstetric precautions including but not limited to vaginal bleeding, contractions, leaking of fluid and fetal movement were reviewed in detail with the patient. Please refer to After Visit Summary for other counseling recommendations.  Return in 1 week (on 05/04/2016).   Eino FarberWalidah Kennith GainN Karim, CNM   Pt called back with glucose log - wnl  12/21 69, 102, 110, 117  12/22 70, 110, 115, 118  12/23 76, 115, 116, 120  12/24 90, 111, 120, 122  12/25 86, 111, 121, 125  12/26 89, 112, 116, 120

## 2016-04-27 NOTE — Patient Instructions (Signed)
Third Trimester of Pregnancy The third trimester is from week 29 through week 40 (months 7 through 9). The third trimester is a time when the unborn baby (fetus) is growing rapidly. At the end of the ninth month, the fetus is about 20 inches in length and weighs 6-10 pounds. Body changes during your third trimester Your body goes through many changes during pregnancy. The changes vary from woman to woman. During the third trimester:  Your weight will continue to increase. You can expect to gain 25-35 pounds (11-16 kg) by the end of the pregnancy.  You may begin to get stretch marks on your hips, abdomen, and breasts.  You may urinate more often because the fetus is moving lower into your pelvis and pressing on your bladder.  You may develop or continue to have heartburn. This is caused by increased hormones that slow down muscles in the digestive tract.  You may develop or continue to have constipation because increased hormones slow digestion and cause the muscles that push waste through your intestines to relax.  You may develop hemorrhoids. These are swollen veins (varicose veins) in the rectum that can itch or be painful.  You may develop swollen, bulging veins (varicose veins) in your legs.  You may have increased body aches in the pelvis, back, or thighs. This is due to weight gain and increased hormones that are relaxing your joints.  You may have changes in your hair. These can include thickening of your hair, rapid growth, and changes in texture. Some women also have hair loss during or after pregnancy, or hair that feels dry or thin. Your hair will most likely return to normal after your baby is born.  Your breasts will continue to grow and they will continue to become tender. A yellow fluid (colostrum) may leak from your breasts. This is the first milk you are producing for your baby.  Your belly button may stick out.  You may notice more swelling in your hands, face, or  ankles.  You may have increased tingling or numbness in your hands, arms, and legs. The skin on your belly may also feel numb.  You may feel short of breath because of your expanding uterus.  You may have more problems sleeping. This can be caused by the size of your belly, increased need to urinate, and an increase in your body's metabolism.  You may notice the fetus "dropping," or moving lower in your abdomen.  You may have increased vaginal discharge.  Your cervix becomes thin and soft (effaced) near your due date. What to expect at prenatal visits You will have prenatal exams every 2 weeks until week 36. Then you will have weekly prenatal exams. During a routine prenatal visit:  You will be weighed to make sure you and the fetus are growing normally.  Your blood pressure will be taken.  Your abdomen will be measured to track your baby's growth.  The fetal heartbeat will be listened to.  Any test results from the previous visit will be discussed.  You may have a cervical check near your due date to see if you have effaced. At around 36 weeks, your health care provider will check your cervix. At the same time, your health care provider will also perform a test on the secretions of the vaginal tissue. This test is to determine if a type of bacteria, Group B streptococcus, is present. Your health care provider will explain this further. Your health care provider may ask you:    What your birth plan is.  How you are feeling.  If you are feeling the baby move.  If you have had any abnormal symptoms, such as leaking fluid, bleeding, severe headaches, or abdominal cramping.  If you are using any tobacco products, including cigarettes, chewing tobacco, and electronic cigarettes.  If you have any questions. Other tests or screenings that may be performed during your third trimester include:  Blood tests that check for low iron levels (anemia).  Fetal testing to check the health,  activity level, and growth of the fetus. Testing is done if you have certain medical conditions or if there are problems during the pregnancy.  Nonstress test (NST). This test checks the health of your baby to make sure there are no signs of problems, such as the baby not getting enough oxygen. During this test, a belt is placed around your belly. The baby is made to move, and its heart rate is monitored during movement. What is false labor? False labor is a condition in which you feel small, irregular tightenings of the muscles in the womb (contractions) that eventually go away. These are called Braxton Hicks contractions. Contractions may last for hours, days, or even weeks before true labor sets in. If contractions come at regular intervals, become more frequent, increase in intensity, or become painful, you should see your health care provider. What are the signs of labor?  Abdominal cramps.  Regular contractions that start at 10 minutes apart and become stronger and more frequent with time.  Contractions that start on the top of the uterus and spread down to the lower abdomen and back.  Increased pelvic pressure and dull back pain.  A watery or bloody mucus discharge that comes from the vagina.  Leaking of amniotic fluid. This is also known as your "water breaking." It could be a slow trickle or a gush. Let your doctor know if it has a color or strange odor. If you have any of these signs, call your health care provider right away, even if it is before your due date. Follow these instructions at home: Eating and drinking  Continue to eat regular, healthy meals.  Do not eat:  Raw meat or meat spreads.  Unpasteurized milk or cheese.  Unpasteurized juice.  Store-made salad.  Refrigerated smoked seafood.  Hot dogs or deli meat, unless they are piping hot.  More than 6 ounces of albacore tuna a week.  Shark, swordfish, king mackerel, or tile fish.  Store-made salads.  Raw  sprouts, such as mung bean or alfalfa sprouts.  Take prenatal vitamins as told by your health care provider.  Take 1000 mg of calcium daily as told by your health care provider.  If you develop constipation:  Take over-the-counter or prescription medicines.  Drink enough fluid to keep your urine clear or pale yellow.  Eat foods that are high in fiber, such as fresh fruits and vegetables, whole grains, and beans.  Limit foods that are high in fat and processed sugars, such as fried and sweet foods. Activity  Exercise only as directed by your health care provider. Healthy pregnant women should aim for 2 hours and 30 minutes of moderate exercise per week. If you experience any pain or discomfort while exercising, stop.  Avoid heavy lifting.  Do not exercise in extreme heat or humidity, or at high altitudes.  Wear low-heel, comfortable shoes.  Practice good posture.  Do not travel far distances unless it is absolutely necessary and only with the approval   of your health care provider.  Wear your seat belt at all times while in a car, on a bus, or on a plane.  Take frequent breaks and rest with your legs elevated if you have leg cramps or low back pain.  Do not use hot tubs, steam rooms, or saunas.  You may continue to have sex unless your health care provider tells you otherwise. Lifestyle  Do not use any products that contain nicotine or tobacco, such as cigarettes and e-cigarettes. If you need help quitting, ask your health care provider.  Do not drink alcohol.  Do not use any medicinal herbs or unprescribed drugs. These chemicals affect the formation and growth of the baby.  If you develop varicose veins:  Wear support pantyhose or compression stockings as told by your healthcare provider.  Elevate your feet for 15 minutes, 3-4 times a day.  Wear a supportive maternity bra to help with breast tenderness. General instructions  Take over-the-counter and prescription  medicines only as told by your health care provider. There are medicines that are either safe or unsafe to take during pregnancy.  Take warm sitz baths to soothe any pain or discomfort caused by hemorrhoids. Use hemorrhoid cream or witch hazel if your health care provider approves.  Avoid cat litter boxes and soil used by cats. These carry germs that can cause birth defects in the baby. If you have a cat, ask someone to clean the litter box for you.  To prepare for the arrival of your baby:  Take prenatal classes to understand, practice, and ask questions about the labor and delivery.  Make a trial run to the hospital.  Visit the hospital and tour the maternity area.  Arrange for maternity or paternity leave through employers.  Arrange for family and friends to take care of pets while you are in the hospital.  Purchase a rear-facing car seat and make sure you know how to install it in your car.  Pack your hospital bag.  Prepare the baby's nursery. Make sure to remove all pillows and stuffed animals from the baby's crib to prevent suffocation.  Visit your dentist if you have not gone during your pregnancy. Use a soft toothbrush to brush your teeth and be gentle when you floss.  Keep all prenatal follow-up visits as told by your health care provider. This is important. Contact a health care provider if:  You are unsure if you are in labor or if your water has broken.  You become dizzy.  You have mild pelvic cramps, pelvic pressure, or nagging pain in your abdominal area.  You have lower back pain.  You have persistent nausea, vomiting, or diarrhea.  You have an unusual or bad smelling vaginal discharge.  You have pain when you urinate. Get help right away if:  You have a fever.  You are leaking fluid from your vagina.  You have spotting or bleeding from your vagina.  You have severe abdominal pain or cramping.  You have rapid weight loss or weight gain.  You have  shortness of breath with chest pain.  You notice sudden or extreme swelling of your face, hands, ankles, feet, or legs.  Your baby makes fewer than 10 movements in 2 hours.  You have severe headaches that do not go away with medicine.  You have vision changes. Summary  The third trimester is from week 29 through week 40, months 7 through 9. The third trimester is a time when the unborn baby (fetus)   is growing rapidly.  During the third trimester, your discomfort may increase as you and your baby continue to gain weight. You may have abdominal, leg, and back pain, sleeping problems, and an increased need to urinate.  During the third trimester your breasts will keep growing and they will continue to become tender. A yellow fluid (colostrum) may leak from your breasts. This is the first milk you are producing for your baby.  False labor is a condition in which you feel small, irregular tightenings of the muscles in the womb (contractions) that eventually go away. These are called Braxton Hicks contractions. Contractions may last for hours, days, or even weeks before true labor sets in.  Signs of labor can include: abdominal cramps; regular contractions that start at 10 minutes apart and become stronger and more frequent with time; watery or bloody mucus discharge that comes from the vagina; increased pelvic pressure and dull back pain; and leaking of amniotic fluid. This information is not intended to replace advice given to you by your health care provider. Make sure you discuss any questions you have with your health care provider. Document Released: 04/12/2001 Document Revised: 09/24/2015 Document Reviewed: 06/19/2012 Elsevier Interactive Patient Education  2017 [employer]  

## 2016-04-27 NOTE — Progress Notes (Signed)
See full note

## 2016-04-28 LAB — GC/CHLAMYDIA PROBE AMP (~~LOC~~) NOT AT ARMC
CHLAMYDIA, DNA PROBE: NEGATIVE
Neisseria Gonorrhea: NEGATIVE

## 2016-04-29 ENCOUNTER — Telehealth: Payer: Self-pay

## 2016-04-29 LAB — CULTURE, BETA STREP (GROUP B ONLY)

## 2016-04-29 MED ORDER — HYDROCORTISONE ACE-PRAMOXINE 1-1 % RE FOAM: 1.0000 | Freq: Two times a day (BID) | RECTAL | 0 refills | Status: DC

## 2016-04-29 NOTE — Telephone Encounter (Signed)
Pt called requested a refill on her cream for hemorrhoids.  LM for pt that her medication that she requested has been refilled and sent to her [facility] pharmacy off [address].  If she has any questions to please give us a call.

## 2016-05-02 NOTE — L&D Delivery Note (Signed)
OB Delivery Summary:   21 y.o. G1P0 at 192w1d delivered a viable female infant in cephalic, ROA position. There was no nuchal cord. The anterior shoulder delivered with ease. A 60 sec delayed cord clamping was performed. Cord clamped x2 and cut. Placenta delivered spontaneously intact, with 3VC. Fundus firm on exam with massage and pitocin. Good hemostasis noted.  Anesthesia: No epidural Laceration: Bilateral peri-urethral lacerations Suture: 4-0 monocryl Good hemostasis noted. EBL: 300 cc  Mom and baby recovering in LDR.    Apgars: APGAR (1 MIN): 7   APGAR (5 MINS): 9   Weight: Pending skin to skin    Gorden HarmsMegan Campbell, MD PGY-2 05/19/2016, 3:40 PM   OB FELLOW DELIVERY ATTESTATION  I was gloved and present for the delivery in its entirety, and I agree with the above resident's note.    Jen MowElizabeth Mumaw, DO OB Fellow

## 2016-05-03 ENCOUNTER — Encounter (HOSPITAL_COMMUNITY): Payer: Self-pay

## 2016-05-03 ENCOUNTER — Inpatient Hospital Stay (HOSPITAL_COMMUNITY)
Admission: AD | Admit: 2016-05-03 | Discharge: 2016-05-03 | Disposition: A | Discharge status: Home / Self Care | Payer: Medicaid Other | Source: Ambulatory Visit | Attending: Family Medicine | Admitting: Family Medicine

## 2016-05-03 DIAGNOSIS — O24419 Gestational diabetes mellitus in pregnancy, unspecified control: Secondary | ICD-10-CM

## 2016-05-03 DIAGNOSIS — O0993 Supervision of high risk pregnancy, unspecified, third trimester: Secondary | ICD-10-CM

## 2016-05-03 DIAGNOSIS — Z3A37 37 weeks gestation of pregnancy: Secondary | ICD-10-CM | POA: Insufficient documentation

## 2016-05-03 NOTE — MAU Note (Signed)
Contractions started around 1430. Getting closer and stronger, now about every 3-4 min.  No bleeding or leaking.

## 2016-05-03 NOTE — MAU Note (Signed)
Notified provider that patient is here for a labor eval. Patient is 1/thick/high.Fetus is reactive. Provider said to discharge patient with labor precautions.

## 2016-05-03 NOTE — Discharge Instructions (Signed)
Introduction Patient Name: ________________________________________________ Patient Due Date: ____________________ What is a fetal movement count? A fetal movement count is the number of times that you feel your baby move during a certain amount of time. This may also be called a fetal kick count. A fetal movement count is recommended for every pregnant woman. You may be asked to start counting fetal movements as early as week 28 of your pregnancy. Pay attention to when your baby is most active. You may notice your baby's sleep and wake cycles. You may also notice things that make your baby move more. You should do a fetal movement count:  When your baby is normally most active.  At the same time each day. A good time to count movements is while you are resting, after having something to eat and drink. How do I count fetal movements? 1. Find a quiet, comfortable area. Sit, or lie down on your side. 2. Write down the date, the start time and stop time, and the number of movements that you felt between those two times. Take this information with you to your health care visits. 3. For 2 hours, count kicks, flutters, swishes, rolls, and jabs. You should feel at least 10 movements during 2 hours. 4. You may stop counting after you have felt 10 movements. 5. If you do not feel 10 movements in 2 hours, have something to eat and drink. Then, keep resting and counting for 1 hour. If you feel at least 4 movements during that hour, you may stop counting. Contact a health care provider if:  You feel fewer than 4 movements in 2 hours.  Your baby is not moving like he or she usually does. Date: ____________ Start time: ____________ Stop time: ____________ Movements: ____________ Date: ____________ Start time: ____________ Stop time: ____________ Movements: ____________ Date: ____________ Start time: ____________ Stop time: ____________ Movements: ____________ Date: ____________ Start time: ____________  Stop time: ____________ Movements: ____________ Date: ____________ Start time: ____________ Stop time: ____________ Movements: ____________ Date: ____________ Start time: ____________ Stop time: ____________ Movements: ____________ Date: ____________ Start time: ____________ Stop time: ____________ Movements: ____________ Date: ____________ Start time: ____________ Stop time: ____________ Movements: ____________ Date: ____________ Start time: ____________ Stop time: ____________ Movements: ____________ This information is not intended to replace advice given to you by your health care provider. Make sure you discuss any questions you have with your health care provider. Document Released: 05/18/2006 Document Revised: 12/16/2015 Document Reviewed: 05/28/2015 Elsevier Interactive Patient Education  2017 [employer] Braxton Hicks Contractions Contractions of the uterus can occur throughout pregnancy. Contractions are not always a sign that you are in labor.  WHAT ARE BRAXTON HICKS CONTRACTIONS?  Contractions that occur before labor are called Braxton Hicks contractions, or false labor. Toward the end of pregnancy (32-34 weeks), these contractions can develop more often and may become more forceful. This is not true labor because these contractions do not result in opening (dilatation) and thinning of the cervix. They are sometimes difficult to tell apart from true labor because these contractions can be forceful and people have different pain tolerances. You should not feel embarrassed if you go to the hospital with false labor. Sometimes, the only way to tell if you are in true labor is for your health care provider to look for changes in the cervix. If there are no prenatal problems or other health problems associated with the pregnancy, it is completely safe to be sent home with false labor and await the onset of true labor.   HOW CAN YOU TELL THE DIFFERENCE BETWEEN TRUE AND FALSE LABOR? False Labor     The contractions of false labor are usually shorter and not as hard as those of true labor.   The contractions are usually irregular.   The contractions are often felt in the front of the lower abdomen and in the groin.   The contractions may go away when you walk around or change positions while lying down.   The contractions get weaker and are shorter lasting as time goes on.   The contractions do not usually become progressively stronger, regular, and closer together as with true labor.  True Labor   Contractions in true labor last 30-70 seconds, become very regular, usually become more intense, and increase in frequency.   The contractions do not go away with walking.   The discomfort is usually felt in the top of the uterus and spreads to the lower abdomen and low back.   True labor can be determined by your health care provider with an exam. This will show that the cervix is dilating and getting thinner.  WHAT TO REMEMBER  Keep up with your usual exercises and follow other instructions given by your health care provider.   Take medicines as directed by your health care provider.   Keep your regular prenatal appointments.   Eat and drink lightly if you think you are going into labor.   If Braxton Hicks contractions are making you uncomfortable:   Change your position from lying down or resting to walking, or from walking to resting.   Sit and rest in a tub of warm water.   Drink 2-3 glasses of water. Dehydration may cause these contractions.   Do slow and deep breathing several times an hour.  WHEN SHOULD I SEEK IMMEDIATE MEDICAL CARE? Seek immediate medical care if:  Your contractions become stronger, more regular, and closer together.   You have fluid leaking or gushing from your vagina.   You have a fever.   You pass blood-tinged mucus.   You have vaginal bleeding.   You have continuous abdominal pain.   You have low back pain  that you never had before.   You feel your baby's head pushing down and causing pelvic pressure.   Your baby is not moving as much as it used to.  This information is not intended to replace advice given to you by your health care provider. Make sure you discuss any questions you have with your health care provider. Document Released: 04/18/2005 Document Revised: 08/10/2015 Document Reviewed: 01/28/2013 Elsevier Interactive Patient Education  2017 [employer]vier Inc.  

## 2016-05-04 ENCOUNTER — Ambulatory Visit (INDEPENDENT_AMBULATORY_CARE_PROVIDER_SITE_OTHER): Payer: Medicaid Other | Admitting: Medical

## 2016-05-04 DIAGNOSIS — O24419 Gestational diabetes mellitus in pregnancy, unspecified control: Secondary | ICD-10-CM

## 2016-05-04 DIAGNOSIS — O24913 Unspecified diabetes mellitus in pregnancy, third trimester: Secondary | ICD-10-CM

## 2016-05-04 NOTE — Progress Notes (Signed)
   PRENATAL VISIT NOTE  Subjective:  Christophe LouisJihan Macon is a 21 y.o. G1P0 at 9795w0d being seen today for ongoing prenatal care.  She is currently monitored for the following issues for this high-risk pregnancy and has Pain in joint, ankle and foot; Nevus, non-neoplastic; Supervision of high risk pregnancy in third trimester; Bacteria in urine; Yeast vaginitis; Gestational diabetes mellitus (GDM) affecting pregnancy, antepartum; Hemorrhoids during pregnancy; and GDM (gestational diabetes mellitus), class A1 on her problem list.  Patient reports no complaints.  Contractions: Irregular. Vag. Bleeding: None.  Movement: Present. Denies leaking of fluid.   The following portions of the patient's history were reviewed and updated as appropriate: allergies, current medications, past family history, past medical history, past social history, past surgical history and problem list. Problem list updated.  Objective:   Vitals:   05/04/16 1257  BP: 112/71  Pulse: 93  Weight: 212 lb (96.2 kg)    Fetal Status: Fetal Heart Rate (bpm): 165 Fundal Height: 37 cm Movement: Present     General:  Alert, oriented and cooperative. Patient is in no acute distress.  Skin: Skin is warm and dry. No rash noted.   Cardiovascular: Normal heart rate noted  Respiratory: Normal respiratory effort, no problems with respiration noted  Abdomen: Soft, gravid, appropriate for gestational age. Pain/Pressure: Present     Pelvic:  Cervical exam deferred        Extremities: Normal range of motion.  Edema: None  Mental Status: Normal mood and affect. Normal behavior. Normal judgment and thought content.   Assessment and Plan:  Pregnancy: G1P0 at 6095w0d  1. Gestational diabetes mellitus (GDM) affecting pregnancy, antepartum - Patient cannot find log book this morning - Patient reports a few elevated numbers, 1or 2 PP in the 140s this week and 2 am > 90 and < 120 - Patient will limit juice and popsicles, knows she has been having too  much - Growth US scheduled for 38 weeks, patient has follow-up in [facility] following US  Term labor symptoms and general obstetric precautions including but not limited to vaginal bleeding, contractions, leaking of fluid and fetal movement were reviewed in detail with the patient. Please refer to After Visit Summary for other counseling recommendations.  [facility]2016[facility]  As scheduled  Marny LowensteinJulie N Aldwin Micalizzi, PA-C

## 2016-05-04 NOTE — Patient Instructions (Signed)
Braxton Hicks Contractions Contractions of the uterus can occur throughout pregnancy. Contractions are not always a sign that you are in labor.  WHAT ARE BRAXTON HICKS CONTRACTIONS?  Contractions that occur before labor are called Braxton Hicks contractions, or false labor. Toward the end of pregnancy (32-34 weeks), these contractions can develop more often and may become more forceful. This is not true labor because these contractions do not result in opening (dilatation) and thinning of the cervix. They are sometimes difficult to tell apart from true labor because these contractions can be forceful and people have different pain tolerances. You should not feel embarrassed if you go to the hospital with false labor. Sometimes, the only way to tell if you are in true labor is for your health care provider to look for changes in the cervix. If there are no prenatal problems or other health problems associated with the pregnancy, it is completely safe to be sent home with false labor and await the onset of true labor. HOW CAN YOU TELL THE DIFFERENCE BETWEEN TRUE AND FALSE LABOR? False Labor   The contractions of false labor are usually shorter and not as hard as those of true labor.   The contractions are usually irregular.   The contractions are often felt in the front of the lower abdomen and in the groin.   The contractions may go away when you walk around or change positions while lying down.   The contractions get weaker and are shorter lasting as time goes on.   The contractions do not usually become progressively stronger, regular, and closer together as with true labor.  True Labor   Contractions in true labor last 30-70 seconds, become very regular, usually become more intense, and increase in frequency.   The contractions do not go away with walking.   The discomfort is usually felt in the top of the uterus and spreads to the lower abdomen and low back.   True labor can be  determined by your health care provider with an exam. This will show that the cervix is dilating and getting thinner.  WHAT TO REMEMBER  Keep up with your usual exercises and follow other instructions given by your health care provider.   Take medicines as directed by your health care provider.   Keep your regular prenatal appointments.   Eat and drink lightly if you think you are going into labor.   If Braxton Hicks contractions are making you uncomfortable:   Change your position from lying down or resting to walking, or from walking to resting.   Sit and rest in a tub of warm water.   Drink 2-3 glasses of water. Dehydration may cause these contractions.   Do slow and deep breathing several times an hour.  WHEN SHOULD I SEEK IMMEDIATE MEDICAL CARE? Seek immediate medical care if:  Your contractions become stronger, more regular, and closer together.   You have fluid leaking or gushing from your vagina.   You have a fever.   You pass blood-tinged mucus.   You have vaginal bleeding.   You have continuous abdominal pain.   You have low back pain that you never had before.   You feel your baby's head pushing down and causing pelvic pressure.   Your baby is not moving as much as it used to.  This information is not intended to replace advice given to you by your health care provider. Make sure you discuss any questions you have with your health care   provider. Document Released: 04/18/2005 Document Revised: 08/10/2015 Document Reviewed: 01/28/2013 Elsevier Interactive Patient Education  2017 [employer] Introduction Patient Name: ________________________________________________ Patient Due Date: ____________________ What is a fetal movement count? A fetal movement count is the number of times that you feel your baby move during a certain amount of time. This may also be called a fetal kick count. A fetal movement count is recommended for every pregnant  woman. You may be asked to start counting fetal movements as early as week 28 of your pregnancy. Pay attention to when your baby is most active. You may notice your baby's sleep and wake cycles. You may also notice things that make your baby move more. You should do a fetal movement count:  When your baby is normally most active.  At the same time each day. A good time to count movements is while you are resting, after having something to eat and drink. How do I count fetal movements? 1. Find a quiet, comfortable area. Sit, or lie down on your side. 2. Write down the date, the start time and stop time, and the number of movements that you felt between those two times. Take this information with you to your health care visits. 3. For 2 hours, count kicks, flutters, swishes, rolls, and jabs. You should feel at least 10 movements during 2 hours. 4. You may stop counting after you have felt 10 movements. 5. If you do not feel 10 movements in 2 hours, have something to eat and drink. Then, keep resting and counting for 1 hour. If you feel at least 4 movements during that hour, you may stop counting. Contact a health care provider if:  You feel fewer than 4 movements in 2 hours.  Your baby is not moving like he or she usually does. Date: ____________ Start time: ____________ Stop time: ____________ Movements: ____________ Date: ____________ Start time: ____________ Stop time: ____________ Movements: ____________ Date: ____________ Start time: ____________ Stop time: ____________ Movements: ____________ Date: ____________ Start time: ____________ Stop time: ____________ Movements: ____________ Date: ____________ Start time: ____________ Stop time: ____________ Movements: ____________ Date: ____________ Start time: ____________ Stop time: ____________ Movements: ____________ Date: ____________ Start time: ____________ Stop time: ____________ Movements: ____________ Date: ____________ Start time:  ____________ Stop time: ____________ Movements: ____________ Date: ____________ Start time: ____________ Stop time: ____________ Movements: ____________ This information is not intended to replace advice given to you by your health care provider. Make sure you discuss any questions you have with your health care provider. Document Released: 05/18/2006 Document Revised: 12/16/2015 Document Reviewed: 05/28/2015 Elsevier Interactive Patient Education  2017 Elsevier Inc.  

## 2016-05-11 ENCOUNTER — Ambulatory Visit (HOSPITAL_COMMUNITY)
Admission: RE | Admit: 2016-05-11 | Discharge: 2016-05-11 | Disposition: A | Discharge status: Home / Self Care | Payer: Medicaid Other | Source: Ambulatory Visit | Attending: Family | Admitting: Family

## 2016-05-11 ENCOUNTER — Ambulatory Visit (INDEPENDENT_AMBULATORY_CARE_PROVIDER_SITE_OTHER): Payer: Medicaid Other | Admitting: Obstetrics and Gynecology

## 2016-05-11 VITALS — BP 134/70 | HR 85 | Wt 212.5 lb

## 2016-05-11 DIAGNOSIS — O0993 Supervision of high risk pregnancy, unspecified, third trimester: Secondary | ICD-10-CM

## 2016-05-11 DIAGNOSIS — O24410 Gestational diabetes mellitus in pregnancy, diet controlled: Secondary | ICD-10-CM

## 2016-05-11 DIAGNOSIS — O24419 Gestational diabetes mellitus in pregnancy, unspecified control: Secondary | ICD-10-CM

## 2016-05-11 DIAGNOSIS — Z3A38 38 weeks gestation of pregnancy: Secondary | ICD-10-CM | POA: Insufficient documentation

## 2016-05-11 DIAGNOSIS — O24919 Unspecified diabetes mellitus in pregnancy, unspecified trimester: Secondary | ICD-10-CM

## 2016-05-11 DIAGNOSIS — Z362 Encounter for other antenatal screening follow-up: Secondary | ICD-10-CM | POA: Diagnosis present

## 2016-05-11 NOTE — Progress Notes (Signed)
   PRENATAL VISIT NOTE  Subjective:  Sally Shannon is a 21 y.o. G1P0 at 6644w0d being seen today for ongoing prenatal care.  She is currently monitored for the following issues for this high-risk pregnancy and has Pain in joint, ankle and foot; Nevus, non-neoplastic; Supervision of high risk pregnancy in third trimester; Bacteria in urine; Yeast vaginitis; Gestational diabetes mellitus (GDM) affecting pregnancy, antepartum; Hemorrhoids during pregnancy; and GDM (gestational diabetes mellitus), class A1 on her problem list.  Patient reports occasional contractions. She did bring her log today. Patient currently drinking a smoothie in the room.   Contractions: Irregular. Vag. Bleeding: None.  Movement: Present. Denies leaking of fluid.   Fasting PPB PPL PPD  87-97, 4/7abnl 94-111, 0/6 abnl 89-120, 2/6 abnl 95-125, 1/6 abnl    The following portions of the patient's history were reviewed and updated as appropriate: allergies, current medications, past family history, past medical history, past social history, past surgical history and problem list. Problem list updated.  Objective:   Vitals:   05/11/16 0953  BP: 134/70  Pulse: 85  Weight: 212 lb 8 oz (96.4 kg)    Fetal Status: Fetal Heart Rate (bpm): US Today Fundal Height: 38 cm Movement: Present    General:  Alert, oriented and cooperative. Patient is in no acute distress.  Skin: Skin is warm and dry. No rash noted.   Cardiovascular: Normal heart rate noted  Respiratory: Normal respiratory effort, no problems with respiration noted  Abdomen: Soft, gravid, appropriate for gestational age. Pain/Pressure: Present     Pelvic:  Dilation: 1 Effacement (%): Thick Station: -3  Extremities: Normal range of motion.  Edema: None  Mental Status: Normal mood and affect. Normal behavior. Normal judgment and thought content.   Assessment and Plan:  Pregnancy: G1P0 at 7144w0d  1. Gestational diabetes mellitus (GDM) affecting pregnancy, antepartum -EFW  at the 63rd %, 3186 gm   -Continue BS log and bring to next weeks visit.   2. Supervision of high risk pregnancy in third trimester - GBS negative  - GC negative    Lengthy discussion about importance of testing blood sugars, bringing log book and keeping appointments. Discussed risks of uncontrolled DM in pregnancy including delayed fetal lung maturity, IUFD and shoulder dystocia possibly resulting brachial plexus palsy, brain damage, intrapartum death and extensive obstetric lacerations. Patient verbalizes understanding. Reviewed BS log with Dr. Alysia PennaErvin.  There are no diagnoses linked to this encounter. Term labor symptoms and general obstetric precautions including but not limited to vaginal bleeding, contractions, leaking of fluid and fetal movement were reviewed in detail with the patient. Please refer to After Visit Summary for other counseling recommendations.  Return in about 1 week (around 05/18/2016).   Duane LopeJennifer I Alaija Ruble, NP

## 2016-05-18 ENCOUNTER — Encounter: Payer: Medicaid Other | Admitting: Advanced Practice Midwife

## 2016-05-18 ENCOUNTER — Inpatient Hospital Stay (HOSPITAL_COMMUNITY)
Admission: AD | Admit: 2016-05-18 | Discharge: 2016-05-19 | Disposition: A | Discharge status: Home / Self Care | Payer: Medicaid Other | Source: Ambulatory Visit | Attending: Family Medicine | Admitting: Family Medicine

## 2016-05-18 ENCOUNTER — Encounter (HOSPITAL_COMMUNITY): Payer: Self-pay

## 2016-05-18 DIAGNOSIS — Z3A39 39 weeks gestation of pregnancy: Secondary | ICD-10-CM

## 2016-05-18 DIAGNOSIS — O0993 Supervision of high risk pregnancy, unspecified, third trimester: Secondary | ICD-10-CM

## 2016-05-18 DIAGNOSIS — O24419 Gestational diabetes mellitus in pregnancy, unspecified control: Secondary | ICD-10-CM

## 2016-05-18 NOTE — MAU Note (Signed)
Woke from a nap and used bathroom and saw pinkish when wiped. Then started having contractions at 7:30 pm.  Every 6-7 min apart now. Baby moving well. No leaking.

## 2016-05-19 ENCOUNTER — Inpatient Hospital Stay (HOSPITAL_COMMUNITY): Payer: Medicaid Other | Admitting: Anesthesiology

## 2016-05-19 ENCOUNTER — Encounter (HOSPITAL_COMMUNITY): Payer: Self-pay

## 2016-05-19 ENCOUNTER — Inpatient Hospital Stay (HOSPITAL_COMMUNITY)
Admission: AD | Admit: 2016-05-19 | Discharge: 2016-05-21 | DRG: 775 | Disposition: A | Discharge status: Home / Self Care | Payer: Medicaid Other | Source: Ambulatory Visit | Attending: Obstetrics & Gynecology | Admitting: Obstetrics & Gynecology

## 2016-05-19 DIAGNOSIS — Z833 Family history of diabetes mellitus: Secondary | ICD-10-CM

## 2016-05-19 DIAGNOSIS — O24420 Gestational diabetes mellitus in childbirth, diet controlled: Secondary | ICD-10-CM | POA: Diagnosis present

## 2016-05-19 DIAGNOSIS — O99214 Obesity complicating childbirth: Secondary | ICD-10-CM | POA: Diagnosis present

## 2016-05-19 DIAGNOSIS — O2243 Hemorrhoids in pregnancy, third trimester: Secondary | ICD-10-CM | POA: Diagnosis present

## 2016-05-19 DIAGNOSIS — Z3490 Encounter for supervision of normal pregnancy, unspecified, unspecified trimester: Secondary | ICD-10-CM

## 2016-05-19 DIAGNOSIS — Z3493 Encounter for supervision of normal pregnancy, unspecified, third trimester: Secondary | ICD-10-CM | POA: Diagnosis present

## 2016-05-19 DIAGNOSIS — Z3A39 39 weeks gestation of pregnancy: Secondary | ICD-10-CM

## 2016-05-19 DIAGNOSIS — O24429 Gestational diabetes mellitus in childbirth, unspecified control: Secondary | ICD-10-CM

## 2016-05-19 DIAGNOSIS — O24419 Gestational diabetes mellitus in pregnancy, unspecified control: Secondary | ICD-10-CM

## 2016-05-19 DIAGNOSIS — O0993 Supervision of high risk pregnancy, unspecified, third trimester: Secondary | ICD-10-CM

## 2016-05-19 LAB — RPR: RPR Ser Ql: NONREACTIVE

## 2016-05-19 LAB — GLUCOSE, CAPILLARY
GLUCOSE-CAPILLARY: 92 mg/dL (ref 65–99)
Glucose-Capillary: 107 mg/dL — ABNORMAL HIGH (ref 65–99)
Glucose-Capillary: 85 mg/dL (ref 65–99)

## 2016-05-19 LAB — TYPE AND SCREEN
ABO/RH(D): O POS
Antibody Screen: NEGATIVE

## 2016-05-19 LAB — CBC
HEMATOCRIT: 38.7 % (ref 36.0–46.0)
HEMOGLOBIN: 13.2 g/dL (ref 12.0–15.0)
MCH: 26.3 pg (ref 26.0–34.0)
MCHC: 34.1 g/dL (ref 30.0–36.0)
MCV: 77.2 fL — AB (ref 78.0–100.0)
Platelets: 336 10*3/uL (ref 150–400)
RBC: 5.01 MIL/uL (ref 3.87–5.11)
RDW: 15.3 % (ref 11.5–15.5)
WBC: 10.5 10*3/uL (ref 4.0–10.5)

## 2016-05-19 MED ORDER — OXYCODONE-ACETAMINOPHEN 5-325 MG PO TABS: 1.0000 | ORAL_TABLET | ORAL | Status: DC | PRN

## 2016-05-19 MED ORDER — SOD CITRATE-CITRIC ACID 500-334 MG/5ML PO SOLN: 30.0000 mL | ORAL | Status: DC | PRN

## 2016-05-19 MED ORDER — PHENYLEPHRINE 40 MCG/ML (10ML) SYRINGE FOR IV PUSH (FOR BLOOD PRESSURE SUPPORT)
80.0000 ug | PREFILLED_SYRINGE | INTRAVENOUS | Status: DC | PRN
Start: 2016-05-19 — End: 2016-05-19
  Filled 2016-05-19: qty 5
  Filled 2016-05-19 (×2): qty 10

## 2016-05-19 MED ORDER — EPHEDRINE 5 MG/ML INJ
10.0000 mg | INTRAVENOUS | Status: DC | PRN
  Administered 2016-05-19: 10 mg via INTRAVENOUS
  Filled 2016-05-19 (×2): qty 4

## 2016-05-19 MED ORDER — DIPHENHYDRAMINE HCL 50 MG/ML IJ SOLN: 12.5000 mg | INTRAMUSCULAR | Status: DC | PRN

## 2016-05-19 MED ORDER — PRENATAL MULTIVITAMIN CH
1.0000 | ORAL_TABLET | Freq: Every day | ORAL | Status: DC
  Administered 2016-05-20 – 2016-05-21 (×2): 1 via ORAL
  Filled 2016-05-19 (×2): qty 1

## 2016-05-19 MED ORDER — SIMETHICONE 80 MG PO CHEW: 80.0000 mg | CHEWABLE_TABLET | ORAL | Status: DC | PRN

## 2016-05-19 MED ORDER — LACTATED RINGERS IV SOLN
INTRAVENOUS | Status: DC
  Administered 2016-05-19 (×2): via INTRAVENOUS

## 2016-05-19 MED ORDER — IBUPROFEN 600 MG PO TABS
600.0000 mg | ORAL_TABLET | Freq: Four times a day (QID) | ORAL | Status: DC
  Administered 2016-05-19 – 2016-05-21 (×8): 600 mg via ORAL
  Filled 2016-05-19 (×8): qty 1

## 2016-05-19 MED ORDER — LACTATED RINGERS IV SOLN
500.0000 mL | Freq: Once | INTRAVENOUS | Status: AC
  Administered 2016-05-19: 500 mL via INTRAVENOUS

## 2016-05-19 MED ORDER — COCONUT OIL OIL
1.0000 "application " | TOPICAL_OIL | Status: DC | PRN
  Filled 2016-05-19: qty 120

## 2016-05-19 MED ORDER — FENTANYL CITRATE (PF) 100 MCG/2ML IJ SOLN
100.0000 ug | INTRAMUSCULAR | Status: DC | PRN
  Administered 2016-05-19: 100 ug via INTRAVENOUS
  Filled 2016-05-19 (×2): qty 2

## 2016-05-19 MED ORDER — FLEET ENEMA 7-19 GM/118ML RE ENEM: 1.0000 | ENEMA | RECTAL | Status: DC | PRN

## 2016-05-19 MED ORDER — PHENYLEPHRINE 40 MCG/ML (10ML) SYRINGE FOR IV PUSH (FOR BLOOD PRESSURE SUPPORT)
80.0000 ug | PREFILLED_SYRINGE | INTRAVENOUS | Status: AC | PRN
  Administered 2016-05-19 (×3): 80 ug via INTRAVENOUS

## 2016-05-19 MED ORDER — FENTANYL 2.5 MCG/ML BUPIVACAINE 1/10 % EPIDURAL INFUSION (WH - ANES)
14.0000 mL/h | INTRAMUSCULAR | Status: DC | PRN
  Administered 2016-05-19 (×2): 14 mL/h via EPIDURAL
  Filled 2016-05-19 (×2): qty 100

## 2016-05-19 MED ORDER — ONDANSETRON HCL 4 MG/2ML IJ SOLN: 4.0000 mg | Freq: Four times a day (QID) | INTRAMUSCULAR | Status: DC | PRN

## 2016-05-19 MED ORDER — ACETAMINOPHEN 325 MG PO TABS: 650.0000 mg | ORAL_TABLET | ORAL | Status: DC | PRN

## 2016-05-19 MED ORDER — OXYTOCIN BOLUS FROM INFUSION
500.0000 mL | Freq: Once | INTRAVENOUS | Status: AC
  Administered 2016-05-19: 500 mL via INTRAVENOUS

## 2016-05-19 MED ORDER — ONDANSETRON HCL 4 MG PO TABS: 4.0000 mg | ORAL_TABLET | ORAL | Status: DC | PRN

## 2016-05-19 MED ORDER — OXYTOCIN 40 UNITS IN LACTATED RINGERS INFUSION - SIMPLE MED
2.5000 [IU]/h | INTRAVENOUS | Status: DC
  Administered 2016-05-19: 2.5 [IU]/h via INTRAVENOUS
  Filled 2016-05-19: qty 1000

## 2016-05-19 MED ORDER — DIBUCAINE 1 % RE OINT: 1.0000 "application " | TOPICAL_OINTMENT | RECTAL | Status: DC | PRN

## 2016-05-19 MED ORDER — ONDANSETRON HCL 4 MG/2ML IJ SOLN: 4.0000 mg | INTRAMUSCULAR | Status: DC | PRN

## 2016-05-19 MED ORDER — BENZOCAINE-MENTHOL 20-0.5 % EX AERO
1.0000 "application " | INHALATION_SPRAY | CUTANEOUS | Status: DC | PRN
  Administered 2016-05-19: 1 via TOPICAL
  Filled 2016-05-19: qty 56

## 2016-05-19 MED ORDER — OXYCODONE-ACETAMINOPHEN 5-325 MG PO TABS: 2.0000 | ORAL_TABLET | ORAL | Status: DC | PRN

## 2016-05-19 MED ORDER — ZOLPIDEM TARTRATE 5 MG PO TABS: 5.0000 mg | ORAL_TABLET | Freq: Every evening | ORAL | Status: DC | PRN

## 2016-05-19 MED ORDER — WITCH HAZEL-GLYCERIN EX PADS: 1.0000 "application " | MEDICATED_PAD | CUTANEOUS | Status: DC | PRN

## 2016-05-19 MED ORDER — DIPHENHYDRAMINE HCL 25 MG PO CAPS: 25.0000 mg | ORAL_CAPSULE | Freq: Four times a day (QID) | ORAL | Status: DC | PRN

## 2016-05-19 MED ORDER — LACTATED RINGERS IV SOLN
500.0000 mL | INTRAVENOUS | Status: DC | PRN
  Administered 2016-05-19: 500 mL via INTRAVENOUS

## 2016-05-19 MED ORDER — SENNOSIDES-DOCUSATE SODIUM 8.6-50 MG PO TABS
2.0000 | ORAL_TABLET | ORAL | Status: DC
  Administered 2016-05-19 – 2016-05-21 (×2): 2 via ORAL
  Filled 2016-05-19 (×2): qty 2

## 2016-05-19 MED ORDER — LIDOCAINE HCL (PF) 1 % IJ SOLN
30.0000 mL | INTRAMUSCULAR | Status: AC | PRN
  Administered 2016-05-19: 30 mL via SUBCUTANEOUS
  Filled 2016-05-19: qty 30

## 2016-05-19 MED ORDER — TETANUS-DIPHTH-ACELL PERTUSSIS 5-2.5-18.5 LF-MCG/0.5 IM SUSP: 0.5000 mL | Freq: Once | INTRAMUSCULAR | Status: DC

## 2016-05-19 MED ORDER — LIDOCAINE HCL (PF) 1 % IJ SOLN
INTRAMUSCULAR | Status: DC | PRN
  Administered 2016-05-19 (×2): 5 mL via EPIDURAL

## 2016-05-19 MED ORDER — EPHEDRINE 5 MG/ML INJ
10.0000 mg | INTRAVENOUS | Status: AC | PRN
  Administered 2016-05-19 (×2): 10 mg via INTRAVENOUS

## 2016-05-19 NOTE — Progress Notes (Signed)
Notified of pt arrival in MAU and exam. Will admit to labor and delivery.  

## 2016-05-19 NOTE — Anesthesia Preprocedure Evaluation (Signed)
Anesthesia Evaluation  Patient identified by MRN, date of birth, ID band Patient awake    Reviewed: Allergy & Precautions, NPO status , Patient's Chart, lab work & pertinent test results  Airway Mallampati: II  TM Distance: >3 FB Neck ROM: Full    Dental no notable dental hx. (+) Dental Advisory Given   Pulmonary neg pulmonary ROS,    Pulmonary exam normal        Cardiovascular negative cardio ROS Normal cardiovascular exam     Neuro/Psych negative neurological ROS  negative psych ROS   GI/Hepatic negative GI ROS, Neg liver ROS,   Endo/Other  diabetesMorbid obesity  Renal/GU negative Renal ROS  negative genitourinary   Musculoskeletal negative musculoskeletal ROS (+)   Abdominal   Peds negative pediatric ROS (+)  Hematology negative hematology ROS (+)   Anesthesia Other Findings   Reproductive/Obstetrics (+) Pregnancy                             Anesthesia Physical Anesthesia Plan  ASA: II  Anesthesia Plan: Epidural   Post-op Pain Management:    Induction:   Airway Management Planned: Natural Airway  Additional Equipment:   Intra-op Plan:   Post-operative Plan:   Informed Consent: I have reviewed the patients History and Physical, chart, labs and discussed the procedure including the risks, benefits and alternatives for the proposed anesthesia with the patient or authorized representative who has indicated his/her understanding and acceptance.   Dental advisory given  Plan Discussed with: Anesthesiologist  Anesthesia Plan Comments:         Anesthesia Quick Evaluation

## 2016-05-19 NOTE — Anesthesia Pain Management Evaluation Note (Signed)
  CRNA Pain Management Visit Note  Patient: Sally Shannon, 21 y.o., female  "Hello I am a [facility] the [facility]. We have an anesthesia team available at all times to provide care throughout the hospital, including epidural management and anesthesia for C-section. I don't know your plan for the delivery whether it a natural birth, water birth, IV sedation, nitrous supplementation, doula or epidural, but we want to meet your pain goals."   1.Was your pain managed to your expectations on prior hospitalizations?   No prior hospitalizations  2.What is your expectation for pain management during this hospitalization?     Epidural  3.How can we help you reach that goal? Epidural inplace, pt comfortable  Record the patient's initial score and the patient's pain goal.   Pain: 3  Pain Goal: 5 [facility] wants you to be able to say your pain was always managed very well.  [facility] HospitalMERRITT,Solimar Maiden 05/19/2016

## 2016-05-19 NOTE — Progress Notes (Signed)
Patient ID: Sally Shannon, female   DOB: 11/23/1995, 21 y.o.   MRN: [id number]  S: Patient seen & examined for progress of labor. Patient comfortable with epidural. Verified with pharmacy there are no pork products in pitocin, patient would like to try a couple more hours before starting pitocin.    O:  Vitals:   05/19/16 1000 05/19/16 1030 05/19/16 1100 05/19/16 1130  BP: 109/60 (!) 114/38 (!) 104/54 (!) 100/59  Pulse: (!) 107 (!) 124 (!) 140 (!) 125  Resp: 17 19 20 20   Temp:  98.3 F (36.8 C)    TempSrc:  Oral    SpO2:   99% 99%  Weight:      Height:        Dilation: 7 Effacement (%): 80 Cervical Position: Middle Station: -1 Presentation: Vertex Exam by:: Avinash Maltos, MD   FHT: 145 bpm, mod var, no accels, early decels TOCO: q404min   A/P: Will start pitocin Continue expectant management Anticipate SVD

## 2016-05-19 NOTE — Progress Notes (Signed)
S: Patient seen & examined for progress of labor. Patient comfortable with epidural. Feeling some contractions. Good fetal movement.     O:  Vitals:   05/19/16 0815 05/19/16 0830 05/19/16 0900 05/19/16 0930  BP: 123/69 115/63 (!) 121/56 114/62  Pulse: (!) 125 (!) 123 (!) 115 (!) 101  Resp: 19 19 19 18   Temp:      TempSrc:      SpO2: 97% 98% 97% 98%  Weight:      Height:        Dilation: 6 Effacement (%): 80 Cervical Position: Middle Station: -2 Presentation: Vertex Exam by:: Orvan Falconerampbell, MD   FHT: 150s bpm, mod var, +accels, no decels TOCO: q4-465min   A/P: AROM with clear, moderate amount of fluid Continue expectant management Anticipate SVD   Gorden HarmsMegan Tristan Proto, MD PGY-2 05/19/2016 9:53 AM

## 2016-05-19 NOTE — Anesthesia Procedure Notes (Signed)
Epidural Patient location during procedure: OB Start time: 05/19/2016 6:12 AM End time: 05/19/2016 6:26 AM  Staffing Anesthesiologist: Heather RobertsSINGER, Krystan Northrop Performed: anesthesiologist   Preanesthetic Checklist Completed: patient identified, site marked, pre-op evaluation, timeout performed, IV checked, risks and benefits discussed and monitors and equipment checked  Epidural Patient position: sitting Prep: DuraPrep Patient monitoring: heart rate, cardiac monitor, continuous pulse ox and blood pressure Approach: midline Location: L2-L3 Injection technique: LOR saline  Needle:  Needle type: Tuohy  Needle gauge: 17 G Needle length: 9 cm Needle insertion depth: 8 cm Catheter size: 20 Guage Catheter at skin depth: 12 cm Test dose: negative and Other  Assessment Events: blood not aspirated, injection not painful, no injection resistance and negative IV test  Additional Notes Informed consent obtained prior to proceeding including risk of failure, 1% risk of PDPH, risk of minor discomfort and bruising.  Discussed rare but serious complications including epidural abscess, permanent nerve injury, epidural hematoma.  Discussed alternatives to epidural analgesia and patient desires to proceed.  Timeout performed pre-procedure verifying patient name, procedure, and platelet count.  Patient tolerated procedure well.

## 2016-05-19 NOTE — Progress Notes (Signed)
S: Patient seen & examined for progress of labor. Patient with epidural, but reporting pressure with contractions. Patient with fetal bradycardia on the monitor.    O:  Vitals:   05/19/16 1200 05/19/16 1230 05/19/16 1300 05/19/16 1330  BP: 112/69 121/65 118/65 109/61  Pulse: (!) 110 (!) 107 (!) 125 (!) 128  Resp: 19 18 20 20   Temp:  98.7 F (37.1 C)    TempSrc:  Oral    SpO2: 98%   99%  Weight:      Height:        Dilation: 7.5 Effacement (%): 90 Cervical Position: Middle Station: +1 Presentation: Vertex Exam by:: Mumaw, MD   FHT: 150s bpm, mod var, +accels, prolonged decels and occasional variables TOCO: q2 min   A/P: Fetal scalp electrode placed to allow for more accurate fetal monitoring Progressing at this time.  Continue expectant management Anticipate SVD   Gorden HarmsMegan Aniqa Hare, MD PGY-2 05/19/2016 1:50 PM

## 2016-05-19 NOTE — MAU Note (Signed)
Pt presents complaining of worsening contractions since leaving MAU. Denies leaking or bleeding. Reports good fetal movement.

## 2016-05-19 NOTE — Progress Notes (Signed)
Notified of pt unchanged cervix. Ok to discharge home with labor precautions

## 2016-05-19 NOTE — H&P (Signed)
LABOR AND DELIVERY ADMISSION HISTORY AND PHYSICAL NOTE  Sally Shannon is a 21 y.o. female G1P0 with IUP at [redacted]w[redacted]d by [place] presenting for labor.  Patient woke up from nap yesterday and used bathroom and reported seeing pink when she wiped. Started having contractions around 1900 that were every 6-54min. Seen in MAU and was dilated to 1cm and spent a couple of hours in observation and did some walking and cervix was unchanged and was discharged.  Back again to MAU and progressed to 3cm over 3 hours and effacement changed from thick to 90%. Continuing to have irregular contractions every 4-77min. Denies any leakage of fluids or vaginal bleeding.  Denies HA, changes in vision, CP, SOB, NV or diarrhea.  She does report positive fetal movement.   Prenatal History/Complications:  Past Medical History: Past Medical History:  Diagnosis Date  . Anemia     Past Surgical History: Past Surgical History:  Procedure Laterality Date  . WISDOM TOOTH EXTRACTION      Obstetrical History: OB History    Gravida Para Term Preterm AB Living   1             SAB TAB Ectopic Multiple Live Births                  Social History: Social History   Social History  . Marital status: Single    Spouse name: N/A  . Number of children: N/A  . Years of education: N/A   Social History Main Topics  . Smoking status: Never Smoker  . Smokeless tobacco: Never Used  . Alcohol use No  . Drug use: No  . Sexual activity: Yes   Other Topics Concern  . None   Social History Narrative  . None    Family History: Family History  Problem Relation Age of Onset  . Anemia Mother   . Depression Mother   . Diabetes Mother   . Anemia Maternal Grandmother     Allergies: No Known Allergies  Prescriptions Prior to Admission  Medication Sig Dispense Refill Last Dose  . hydrocortisone-pramoxine (PROCTOFOAM HC) rectal foam Place 1 applicator rectally 2 (two) times daily. 10 g 0 05/18/2016 at Unknown time  . Prenat w/o A  Vit-FeFum-FePo-FA (CONCEPT OB) 130-92.4-1 MG CAPS Take 1 tablet by mouth daily. 30 capsule 12 05/18/2016 at Unknown time  . ACCU-CHEK SOFTCLIX LANCETS lancets See admin instructions.  1 05/17/2016 at Unknown time  . glucose blood (ACCU-CHEK AVIVA PLUS) test strip Use as instructed 100 each 12 05/17/2016 at Unknown time  . Lancet Devices (ACCU-CHEK SOFTCLIX) lancets Use as instructed 1 each 11 05/17/2016 at Unknown time  . ranitidine (ZANTAC) 150 MG tablet Take 150 mg by mouth daily as needed for heartburn.   Taking     Review of Systems   All systems reviewed and negative except as stated in HPI  Blood pressure 137/91, pulse 115, temperature 98.4 F (36.9 C), temperature source Oral, resp. rate 20, last menstrual period 08/19/2015, SpO2 100 %. General appearance: alert and cooperative Lungs: clear to auscultation bilaterally Heart: regular rate and rhythm Abdomen: soft, non-tender; bowel sounds normal Extremities: No calf swelling or tenderness Presentation: cephalic, vertex position Fetal monitoring: FHR: 140s, mod variability, pos accels, no decels, irregular contractions q4-6 min Uterine activity:  Dilation: 3 Effacement (%): 90 Station: -3 Exam by:: Ma Hillock RNC   Prenatal labs: ABO, Rh: O/POS/-- (07/18 1129) Antibody: NEG (07/18 1129) Rubella: 19.8 RPR: NON REAC (11/08 0946)  HBsAg: NEGATIVE (  07/18 1129)  HIV: NONREACTIVE (11/08 0946)  GBS:   negative 1 hr Glucola: GDM Genetic screening:  neg Anatomy US: neg  Prenatal Transfer Tool  Maternal Diabetes: Yes:  Diabetes Type:  Diet controlled Genetic Screening: Normal Maternal Ultrasounds/Referrals: Normal Fetal Ultrasounds or other Referrals:  None Maternal Substance Abuse:  No Significant Maternal Medications:  None Significant Maternal Lab Results: None  No results found for this or any previous visit (from the past 24 hour(s)).  Patient Active Problem List   Diagnosis Date Noted  . Pregnancy 05/19/2016  . GDM  (gestational diabetes mellitus), class A1 04/27/2016  . Hemorrhoids during pregnancy 04/11/2016  . Gestational diabetes mellitus (GDM) affecting pregnancy, antepartum 03/11/2016  . Yeast vaginitis 02/10/2016  . Bacteria in urine 01/13/2016  . Supervision of high risk pregnancy in third trimester 11/17/2015  . Pain in joint, ankle and foot 10/06/2013  . Nevus, non-neoplastic 10/06/2013    Assessment: Sally Shannon is a 21 y.o. G1P0 at 5056w1d here for labor  #Labor: expectant management, anticipate SVD #Pain: IV pain medication/epidural #FWB: Category I #ID: GBS neg #MOF: Breast #MOC: IUD  Renne Muscaaniel L Warden, MD PGY-1 05/19/2016, 4:07 AM  CNM attestation:  I have seen and examined this patient; I agree with above documentation in the resident's note.   Sally Shannon is a 21 y.o. G1P0 here for reg ctx; her preg has been remarkable for GDM A1  PE: BP (!) 110/55   Pulse (!) 102   Temp 98.3 F (36.8 C) (Oral)   Resp 20   Ht 5\' 4"  (1.626 m)   Wt 98.9 kg (218 lb)   LMP 08/19/2015   SpO2 97%   BMI 37.42 kg/m   Resp: normal effort, no distress Abd: gravid  ROS, labs, PMH reviewed  CBG: 92  [facility]: [facility] Anticipate SVD Plans epidural  Cam HaiSHAW, Sally Shannon CNM 05/19/2016, 7:44 AM

## 2016-05-19 NOTE — Discharge Instructions (Signed)
Third Trimester of Pregnancy °The third trimester is from week 29 through week 40 (months 7 through 9). The third trimester is a time when the unborn baby (fetus) is growing rapidly. At the end of the ninth month, the fetus is about 20 inches in length and weighs 6-10 pounds. °Body changes during your third trimester °Your body goes through many changes during pregnancy. The changes vary from woman to woman. During the third trimester: °· Your weight will continue to increase. You can expect to gain 25-35 pounds (11-16 kg) by the end of the pregnancy. °· You may begin to get stretch marks on your hips, abdomen, and breasts. °· You may urinate more often because the fetus is moving lower into your pelvis and pressing on your bladder. °· You may develop or continue to have heartburn. This is caused by increased hormones that slow down muscles in the digestive tract. °· You may develop or continue to have constipation because increased hormones slow digestion and cause the muscles that push waste through your intestines to relax. °· You may develop hemorrhoids. These are swollen veins (varicose veins) in the rectum that can itch or be painful. °· You may develop swollen, bulging veins (varicose veins) in your legs. °· You may have increased body aches in the pelvis, back, or thighs. This is due to weight gain and increased hormones that are relaxing your joints. °· You may have changes in your hair. These can include thickening of your hair, rapid growth, and changes in texture. Some women also have hair loss during or after pregnancy, or hair that feels dry or thin. Your hair will most likely return to normal after your baby is born. °· Your breasts will continue to grow and they will continue to become tender. A yellow fluid (colostrum) may leak from your breasts. This is the first milk you are producing for your baby. °· Your belly button may stick out. °· You may notice more swelling in your hands, face, or  ankles. °· You may have increased tingling or numbness in your hands, arms, and legs. The skin on your belly may also feel numb. °· You may feel short of breath because of your expanding uterus. °· You may have more problems sleeping. This can be caused by the size of your belly, increased need to urinate, and an increase in your body's metabolism. °· You may notice the fetus "dropping," or moving lower in your abdomen. °· You may have increased vaginal discharge. °· Your cervix becomes thin and soft (effaced) near your due date. °What to expect at prenatal visits °You will have prenatal exams every 2 weeks until week 36. Then you will have weekly prenatal exams. During a routine prenatal visit: °· You will be weighed to make sure you and the fetus are growing normally. °· Your blood pressure will be taken. °· Your abdomen will be measured to track your baby's growth. °· The fetal heartbeat will be listened to. °· Any test results from the previous visit will be discussed. °· You may have a cervical check near your due date to see if you have effaced. °At around 36 weeks, your health care provider will check your cervix. At the same time, your health care provider will also perform a test on the secretions of the vaginal tissue. This test is to determine if a type of bacteria, Group B streptococcus, is present. Your health care provider will explain this further. °Your health care provider may ask you: °·   What your birth plan is. °· How you are feeling. °· If you are feeling the baby move. °· If you have had any abnormal symptoms, such as leaking fluid, bleeding, severe headaches, or abdominal cramping. °· If you are using any tobacco products, including cigarettes, chewing tobacco, and electronic cigarettes. °· If you have any questions. °Other tests or screenings that may be performed during your third trimester include: °· Blood tests that check for low iron levels (anemia). °· Fetal testing to check the health,  activity level, and growth of the fetus. Testing is done if you have certain medical conditions or if there are problems during the pregnancy. °· Nonstress test (NST). This test checks the health of your baby to make sure there are no signs of problems, such as the baby not getting enough oxygen. During this test, a belt is placed around your belly. The baby is made to move, and its heart rate is monitored during movement. °What is false labor? °False labor is a condition in which you feel small, irregular tightenings of the muscles in the womb (contractions) that eventually go away. These are called Braxton Hicks contractions. Contractions may last for hours, days, or even weeks before true labor sets in. If contractions come at regular intervals, become more frequent, increase in intensity, or become painful, you should see your health care provider. °What are the signs of labor? °· Abdominal cramps. °· Regular contractions that start at 10 minutes apart and become stronger and more frequent with time. °· Contractions that start on the top of the uterus and spread down to the lower abdomen and back. °· Increased pelvic pressure and dull back pain. °· A watery or bloody mucus discharge that comes from the vagina. °· Leaking of amniotic fluid. This is also known as your "water breaking." It could be a slow trickle or a gush. Let your doctor know if it has a color or strange odor. °If you have any of these signs, call your health care provider right away, even if it is before your due date. °Follow these instructions at home: °Eating and drinking °· Continue to eat regular, healthy meals. °· Do not eat: °¨ Raw meat or meat spreads. °¨ Unpasteurized milk or cheese. °¨ Unpasteurized juice. °¨ Store-made salad. °¨ Refrigerated smoked seafood. °¨ Hot dogs or deli meat, unless they are piping hot. °¨ More than 6 ounces of albacore tuna a week. °¨ Shark, swordfish, king mackerel, or tile fish. °¨ Store-made salads. °¨ Raw  sprouts, such as mung bean or alfalfa sprouts. °· Take prenatal vitamins as told by your health care provider. °· Take 1000 mg of calcium daily as told by your health care provider. °· If you develop constipation: °¨ Take over-the-counter or prescription medicines. °¨ Drink enough fluid to keep your urine clear or pale yellow. °¨ Eat foods that are high in fiber, such as fresh fruits and vegetables, whole grains, and beans. °¨ Limit foods that are high in fat and processed sugars, such as fried and sweet foods. °Activity °· Exercise only as directed by your health care provider. Healthy pregnant women should aim for 2 hours and 30 minutes of moderate exercise per week. If you experience any pain or discomfort while exercising, stop. °· Avoid heavy lifting. °· Do not exercise in extreme heat or humidity, or at high altitudes. °· Wear low-heel, comfortable shoes. °· Practice good posture. °· Do not travel far distances unless it is absolutely necessary and only with the approval   of your health care provider. °· Wear your seat belt at all times while in a car, on a bus, or on a plane. °· Take frequent breaks and rest with your legs elevated if you have leg cramps or low back pain. °· Do not use hot tubs, steam rooms, or saunas. °· You may continue to have sex unless your health care provider tells you otherwise. °Lifestyle °· Do not use any products that contain nicotine or tobacco, such as cigarettes and e-cigarettes. If you need help quitting, ask your health care provider. °· Do not drink alcohol. °· Do not use any medicinal herbs or unprescribed drugs. These chemicals affect the formation and growth of the baby. °· If you develop varicose veins: °¨ Wear support pantyhose or compression stockings as told by your healthcare provider. °¨ Elevate your feet for 15 minutes, 3-4 times a day. °· Wear a supportive maternity bra to help with breast tenderness. °General instructions °· Take over-the-counter and prescription  medicines only as told by your health care provider. There are medicines that are either safe or unsafe to take during pregnancy. °· Take warm sitz baths to soothe any pain or discomfort caused by hemorrhoids. Use hemorrhoid cream or witch hazel if your health care provider approves. °· Avoid cat litter boxes and soil used by cats. These carry germs that can cause birth defects in the baby. If you have a cat, ask someone to clean the litter box for you. °· To prepare for the arrival of your baby: °¨ Take prenatal classes to understand, practice, and ask questions about the labor and delivery. °¨ Make a trial run to the hospital. °¨ Visit the hospital and tour the maternity area. °¨ Arrange for maternity or paternity leave through employers. °¨ Arrange for family and friends to take care of pets while you are in the hospital. °¨ Purchase a rear-facing car seat and make sure you know how to install it in your car. °¨ Pack your hospital bag. °¨ Prepare the baby’s nursery. Make sure to remove all pillows and stuffed animals from the baby's crib to prevent suffocation. °· Visit your dentist if you have not gone during your pregnancy. Use a soft toothbrush to brush your teeth and be gentle when you floss. °· Keep all prenatal follow-up visits as told by your health care provider. This is important. °Contact a health care provider if: °· You are unsure if you are in labor or if your water has broken. °· You become dizzy. °· You have mild pelvic cramps, pelvic pressure, or nagging pain in your abdominal area. °· You have lower back pain. °· You have persistent nausea, vomiting, or diarrhea. °· You have an unusual or bad smelling vaginal discharge. °· You have pain when you urinate. °Get help right away if: °· You have a fever. °· You are leaking fluid from your vagina. °· You have spotting or bleeding from your vagina. °· You have severe abdominal pain or cramping. °· You have rapid weight loss or weight gain. °· You have  shortness of breath with chest pain. °· You notice sudden or extreme swelling of your face, hands, ankles, feet, or legs. °· Your baby makes fewer than 10 movements in 2 hours. °· You have severe headaches that do not go away with medicine. °· You have vision changes. °Summary °· The third trimester is from week 29 through week 40, months 7 through 9. The third trimester is a time when the unborn baby (fetus)   is growing rapidly. °· During the third trimester, your discomfort may increase as you and your baby continue to gain weight. You may have abdominal, leg, and back pain, sleeping problems, and an increased need to urinate. °· During the third trimester your breasts will keep growing and they will continue to become tender. A yellow fluid (colostrum) may leak from your breasts. This is the first milk you are producing for your baby. °· False labor is a condition in which you feel small, irregular tightenings of the muscles in the womb (contractions) that eventually go away. These are called Braxton Hicks contractions. Contractions may last for hours, days, or even weeks before true labor sets in. °· Signs of labor can include: abdominal cramps; regular contractions that start at 10 minutes apart and become stronger and more frequent with time; watery or bloody mucus discharge that comes from the vagina; increased pelvic pressure and dull back pain; and leaking of amniotic fluid. °This information is not intended to replace advice given to you by your health care provider. Make sure you discuss any questions you have with your health care provider. °Document Released: 04/12/2001 Document Revised: 09/24/2015 Document Reviewed: 06/19/2012 °Elsevier Interactive Patient Education © 2017 [employer] °Introduction °Patient Name: ________________________________________________ Patient Due Date: ____________________ °What is a fetal movement count? °A fetal movement count is the number of times that you feel your baby  move during a certain amount of time. This may also be called a fetal kick count. A fetal movement count is recommended for every pregnant woman. You may be asked to start counting fetal movements as early as week 28 of your pregnancy. °Pay attention to when your baby is most active. You may notice your baby's sleep and wake cycles. You may also notice things that make your baby move more. You should do a fetal movement count: °· When your baby is normally most active. °· At the same time each day. °A good time to count movements is while you are resting, after having something to eat and drink. °How do I count fetal movements? °1. Find a quiet, comfortable area. Sit, or lie down on your side. °2. Write down the date, the start time and stop time, and the number of movements that you felt between those two times. Take this information with you to your health care visits. °3. For 2 hours, count kicks, flutters, swishes, rolls, and jabs. You should feel at least 10 movements during 2 hours. °4. You may stop counting after you have felt 10 movements. °5. If you do not feel 10 movements in 2 hours, have something to eat and drink. Then, keep resting and counting for 1 hour. If you feel at least 4 movements during that hour, you may stop counting. °Contact a health care provider if: °· You feel fewer than 4 movements in 2 hours. °· Your baby is not moving like he or she usually does. °Date: ____________ Start time: ____________ Stop time: ____________ Movements: ____________ °Date: ____________ Start time: ____________ Stop time: ____________ Movements: ____________ °Date: ____________ Start time: ____________ Stop time: ____________ Movements: ____________ °Date: ____________ Start time: ____________ Stop time: ____________ Movements: ____________ °Date: ____________ Start time: ____________ Stop time: ____________ Movements: ____________ °Date: ____________ Start time: ____________ Stop time: ____________ Movements:  ____________ °Date: ____________ Start time: ____________ Stop time: ____________ Movements: ____________ °Date: ____________ Start time: ____________ Stop time: ____________ Movements: ____________ °Date: ____________ Start time: ____________ Stop time: ____________ Movements: ____________ °This information is not intended to replace   advice given to you by your health care provider. Make sure you discuss any questions you have with your health care provider. °Document Released: 05/18/2006 Document Revised: 12/16/2015 Document Reviewed: 05/28/2015 °[employer] Interactive Patient Education © 2017 [employer] °Braxton Hicks Contractions °Contractions of the uterus can occur throughout pregnancy. Contractions are not always a sign that you are in labor.  °WHAT ARE BRAXTON HICKS CONTRACTIONS?  °Contractions that occur before labor are called Braxton Hicks contractions, or false labor. Toward the end of pregnancy (32-34 weeks), these contractions can develop more often and may become more forceful. This is not true labor because these contractions do not result in opening (dilatation) and thinning of the cervix. They are sometimes difficult to tell apart from true labor because these contractions can be forceful and people have different pain tolerances. You should not feel embarrassed if you go to the hospital with false labor. Sometimes, the only way to tell if you are in true labor is for your health care provider to look for changes in the cervix. °If there are no prenatal problems or other health problems associated with the pregnancy, it is completely safe to be sent home with false labor and await the onset of true labor. °HOW CAN YOU TELL THE DIFFERENCE BETWEEN TRUE AND FALSE LABOR? °False Labor  °· The contractions of false labor are usually shorter and not as hard as those of true labor.   °· The contractions are usually irregular.   °· The contractions are often felt in the front of the lower abdomen and in  the groin.   °· The contractions may go away when you walk around or change positions while lying down.   °· The contractions get weaker and are shorter lasting as time goes on.   °· The contractions do not usually become progressively stronger, regular, and closer together as with true labor.   °True Labor  °· Contractions in true labor last 30-70 seconds, become very regular, usually become more intense, and increase in frequency.   °· The contractions do not go away with walking.   °· The discomfort is usually felt in the top of the uterus and spreads to the lower abdomen and low back.   °· True labor can be determined by your health care provider with an exam. This will show that the cervix is dilating and getting thinner.   °WHAT TO REMEMBER °· Keep up with your usual exercises and follow other instructions given by your health care provider.   °· Take medicines as directed by your health care provider.   °· Keep your regular prenatal appointments.   °· Eat and drink lightly if you think you are going into labor.   °· If Braxton Hicks contractions are making you uncomfortable:   °¨ Change your position from lying down or resting to walking, or from walking to resting.   °¨ Sit and rest in a tub of warm water.   °¨ Drink 2-3 glasses of water. Dehydration may cause these contractions.   °¨ Do slow and deep breathing several times an hour.   °WHEN SHOULD I SEEK IMMEDIATE MEDICAL CARE? °Seek immediate medical care if: °· Your contractions become stronger, more regular, and closer together.   °· You have fluid leaking or gushing from your vagina.   °· You have a fever.   °· You pass blood-tinged mucus.   °· You have vaginal bleeding.   °· You have continuous abdominal pain.   °· You have low back pain that you never had before.   °· You feel your baby's head pushing down and causing pelvic pressure.   °· Your   baby is not moving as much as it used to.   °This information is not intended to replace advice given to you  by your health care provider. Make sure you discuss any questions you have with your health care provider. °Document Released: 04/18/2005 Document Revised: 08/10/2015 Document Reviewed: 01/28/2013 °Elsevier Interactive Patient Education © 2017 [employer] ° °

## 2016-05-19 NOTE — Anesthesia Postprocedure Evaluation (Signed)
Anesthesia Post Note  Patient: Sally LouisJihan Collington  Procedure(s) Performed: * No procedures listed *  Patient location during evaluation: Mother Baby Anesthesia Type: Epidural Level of consciousness: awake and alert Pain management: pain level controlled Vital Signs Assessment: post-procedure vital signs reviewed and stable Respiratory status: spontaneous breathing, nonlabored ventilation and respiratory function stable Cardiovascular status: stable Postop Assessment: no headache, no backache and epidural receding Anesthetic complications: no        Last Vitals:  Vitals:   05/19/16 1707 05/19/16 1830  BP: 122/66 105/62  Pulse: (!) 106 (!) 105  Resp: 16 18  Temp: 36.5 C 37.4 C    Last Pain:  Vitals:   05/19/16 1830  TempSrc: Oral  PainSc: 6    Pain Goal:                 EchoStarMERRITT,Nikitta Sobiech

## 2016-05-20 NOTE — Progress Notes (Signed)
CM / UR chart review completed.  

## 2016-05-20 NOTE — Lactation Note (Signed)
This note was copied from a baby's chart. Lactation Consultation Note: Mother breastfeeding and independently latching infant when I arrived in room. Infant lying in cradle hold. Infant has wide open mouth . She was suckling with a consistent pattern with observed swallows.  Reviewed basic breastfeeding teaching: cue base feeding , cluster feeding , supply and demand, breast compression and hand expression. Mother easily hand expresses. Advised to feed infant 8-12 times in 24 hours and to rouse infant in 3 hours if infant is not cueing.   Mother was given Lactation brochure and informed of all services, BFSG'S, outpatient appt.,phone hot line for questions.  Mother very receptive to all teaching.   Patient Name: Sally Shannon'UToday's Date: 05/20/2016 Reason for consult: Initial assessment   Maternal Data    Feeding Feeding Type: Breast Fed Length of feed: 15 min  LATCH Score/Interventions Latch: Grasps breast easily, tongue down, lips flanged, rhythmical sucking. Intervention(s): Breast compression  Audible Swallowing: Spontaneous and intermittent Intervention(s): Hand expression  Type of Nipple: Everted at rest and after stimulation  Comfort (Breast/Nipple): Soft / non-tender     Hold (Positioning): No assistance needed to correctly position infant at breast. Intervention(s): Breastfeeding basics reviewed;Support Pillows;Position options;Skin to skin  LATCH Score: 10  Lactation Tools Discussed/Used     Consult Status Consult Status: Follow-up Date: 05/21/16 Follow-up type: In-patient    Stevan BornKendrick, Sargon Scouten [facility] 05/20/2016, 10:30 AM

## 2016-05-20 NOTE — Progress Notes (Signed)
Post Partum Day 1 Late Entry Subjective: no complaints, up ad lib, voiding and tolerating PO  Objective: Blood pressure 126/67, pulse 80, temperature 99.1 F (37.3 C), temperature source Axillary, resp. rate 18, height 5\' 4"  (1.626 m), weight 218 lb (98.9 kg), last menstrual period 08/19/2015, SpO2 100 %, unknown if currently breastfeeding.  Physical Exam:  General: alert, cooperative, appears stated age and no distress Lochia: appropriate Uterine Fundus: firm Incision: NA DVT Evaluation: Negative Homan's sign.   Recent Labs  05/19/16 0428  HGB 13.2  HCT 38.7    Assessment/Plan: Plan for discharge tomorrow, Breastfeeding and Contraception IUD   LOS: 1 day   [place] Rendell Thivierge 05/20/2016, 10:17 PM

## 2016-05-21 NOTE — Discharge Summary (Signed)
OB Discharge Summary     Patient Name: Sally Shannon DOB: 08/30/1995 MRN: [id number]  Date of admission: 05/19/2016 Delivering MD: Michaele OfferMUMAW, ELIZABETH WOODLAND   Date of discharge: 05/21/2016  Admitting diagnosis: 4946w1d, Contractions  Intrauterine pregnancy: 8546w1d     Secondary diagnosis:  Active Problems:   Pregnancy   NSVD (normal spontaneous vaginal delivery)  Additional problems: GDM diet controlled     Discharge diagnosis: Term Pregnancy Delivered                                                                                                Post partum procedures:none  Augmentation: AROM  Complications: None  Hospital course:  Onset of Labor With Vaginal Delivery     21 y.o. yo G1P1001 at 1946w1d was admitted in Latent Labor on 05/19/2016. Patient had an uncomplicated labor course as follows:  Membrane Rupture Time/Date: 9:50 AM ,05/19/2016   Intrapartum Procedures: Episiotomy: None [1]                                         Lacerations:  Periurethral [8]  Patient had a delivery of a Viable infant. 05/19/2016  Information for the patient's newborn:  Caryl PinaBarre, Girl Jamese [[id number]][[id number]]  Delivery Method: Vag-Spont    Pateint had an uncomplicated postpartum course.  She is ambulating, tolerating a regular diet, passing flatus, and urinating well. Patient is discharged home in stable condition on 05/21/16.   Physical exam  Vitals:   05/19/16 2251 05/20/16 0627 05/20/16 1950 05/21/16 0608  BP: 117/61 116/64 126/67 (!) 108/59  Pulse: 88 88 80 92  Resp: 18 18 18 18   Temp: 98.2 F (36.8 C) 98.3 F (36.8 C) 99.1 F (37.3 C) 98.4 F (36.9 C)  TempSrc: Oral Oral Axillary Oral  SpO2:      Weight:      Height:       General: alert, cooperative and no distress Lochia: appropriate Uterine Fundus: firm Incision: NA  DVT Evaluation: No evidence of DVT seen on physical exam. Labs: Lab Results  Component Value Date   WBC 10.5 05/19/2016   HGB 13.2 05/19/2016   HCT 38.7  05/19/2016   MCV 77.2 (L) 05/19/2016   PLT 336 05/19/2016   CMP Latest Ref Rng & Units 06/18/2014  Glucose 70 - 99 mg/dL 94  BUN 6 - 23 mg/dL 13  Creatinine 2.530.50 - 6.641.00 mg/dL 4.030.65  Sodium 474135 - 259145 mmol/L 137  Potassium 3.5 - 5.1 mmol/L 4.1  Chloride 96 - 112 mmol/L 107  CO2 19 - 32 mmol/L 26  Calcium 8.4 - 10.5 mg/dL 9.4  Total Protein 6.0 - 8.3 g/dL 7.7  Total Bilirubin 0.3 - 1.2 mg/dL 0.4  Alkaline Phos 47 - 119 U/L 111  AST 0 - 37 U/L 22  ALT 0 - 35 U/L 16    Discharge instruction: per After Visit Summary and "Baby and Me Booklet".  After visit meds:  Allergies as of 05/21/2016   No Known Allergies  Medication List    STOP taking these medications   accu-chek softclix lancets   ACCU-CHEK SOFTCLIX LANCETS lancets   CONCEPT OB 130-92.4-1 MG Caps   glucose blood test strip Commonly known as:  ACCU-CHEK AVIVA PLUS   hydrocortisone-pramoxine rectal foam Commonly known as:  PROCTOFOAM HC       Diet: routine diet  Activity: Advance as tolerated. Pelvic rest for 6 weeks.   Outpatient follow up:6 weeks Follow up Appt: Future Appointments Date Time Provider Department Center  06/28/2016 2:40 PM Judeth Horn, NP [facility]   Follow up Visit:No Follow-up on file.  Postpartum contraception: IUD unknown  Newborn Data: Live born female  Birth Weight: 7 lb 5.1 oz (3320 g) APGAR: 7, 9  Baby Feeding: Breast Disposition:home with mother   05/21/2016 Marylene Land, CNM

## 2016-05-21 NOTE — Lactation Note (Signed)
This note was copied from a baby's chart. Lactation Consultation Note  Baby latched in football position upon entering.  Sucks and swallows observed. RN in room and recently set up DEBP per mother's request and mother pumped 5 ml. Reviewed foley cup and spoon feeding.  RN will help with bm feeding if needed. Reviewed engorgement care and monitoring voids/stools. Mom encouraged to feed baby 8-12 times/24 hours and with feeding cues.  Encouraged mother to compress breast during feeding to keep baby active. Mother states this is the 1st feeding where she has not had nipple burning. Provided mother w/ manual pump. No questions or further concerns at this time.   Patient Name: Girl Christophe LouisJihan Rolph ZOXWR'UToday's Date: 05/21/2016 Reason for consult: Follow-up assessment   Maternal Data    Feeding Feeding Type: Breast Fed Length of feed: 15 min  LATCH Score/Interventions Latch: Grasps breast easily, tongue down, lips flanged, rhythmical sucking.  Audible Swallowing: A few with stimulation  Type of Nipple: Everted at rest and after stimulation  Comfort (Breast/Nipple): Soft / non-tender     Hold (Positioning): No assistance needed to correctly position infant at breast.  LATCH Score: 9  Lactation Tools Discussed/Used Pump Review: Setup, frequency, and cleaning Initiated by:: RN Date initiated:: 05/21/16   Consult Status Consult Status: Complete    Dahlia ByesBerkelhammer, Ellisha Bankson Boschen 05/21/2016, 10:38 AM

## 2016-05-25 ENCOUNTER — Telehealth: Payer: Self-pay | Admitting: *Deleted

## 2016-05-25 ENCOUNTER — Encounter: Payer: Medicaid Other | Admitting: Advanced Practice Midwife

## 2016-05-25 NOTE — Telephone Encounter (Signed)
Miriam called front desk and asked to speak to a nurse. Call transferred to nurse. I spoke with El SalvadorJihan and she delivered 05/19/16 and is having engorgement. She states she is breastfeeding only and has a lot of milk. She wanted to know if there is a medication she can take. We discussed we do not give a medication to slow her milk production since she is breastfeeding. We discussed her body is learning to make the amount her baby needs by supply/ demand. We discussed not stimulating breast in shower, wearing supportive bra, pumping or expressing if still uncomfortable after nursing just enough for relief. Can use ice packs occasionally,. Will have another nurse who is [facility] certified to call patient. She voices understanding.

## 2016-05-25 NOTE — Telephone Encounter (Signed)
Called patient back and asked how breastfeeding was going. Patient states its going well and baby now weighs 1 oz over birth weight by 546 days old. Patient states she is making too much milk though and is constantly leaking. Patient states she breastfeeds every 1-1.5 hours because she gets so full and uncomfortable. Patient states she wakes the baby to feed and when the baby feeds, she gags & chokes a lot as well as spits up. Patient states she hasn't really tried anything to help with her oversupply. Patient does report hand expressing some to relieve comfort and hand expresses/massages her breast during a feeding. Patient uses side-lying & football hold positioning. [facility] & also recommended she try laid back breastfeeding and a reclined football hold to help baby handle flow better. Also suggested that she may latch baby until she has MER and then can take baby off and let some milk spill out onto a towel then latch baby again. Discussed to try to space feedings between 2-2.5 hours and that she may try hand expressing to comfort at 1.5 hours post feeding until next feeding. Discouraged hand expression and massage with feedings as that tells the body to make more. Told patient to keep nipples dry as best she can as moist covered nipples increases risk of yeast/infection. Told patient I will call her back on Friday to check in with how she is doing. Patient verbalized understanding to all & had no questions

## 2016-05-27 NOTE — Telephone Encounter (Signed)
Called patient, no answer- left message providing outpatient lactation number and options for appts & that coming in to be seen at this point is my recommendation.

## 2016-06-20 ENCOUNTER — Encounter (HOSPITAL_COMMUNITY): Payer: Self-pay | Admitting: Emergency Medicine

## 2016-06-20 ENCOUNTER — Ambulatory Visit (HOSPITAL_COMMUNITY)
Admission: EM | Admit: 2016-06-20 | Discharge: 2016-06-20 | Disposition: A | Discharge status: Home / Self Care | Payer: Medicaid Other | Attending: Internal Medicine | Admitting: Internal Medicine

## 2016-06-20 DIAGNOSIS — H00011 Hordeolum externum right upper eyelid: Secondary | ICD-10-CM

## 2016-06-20 MED ORDER — ERYTHROMYCIN 5 MG/GM OP OINT: TOPICAL_OINTMENT | OPHTHALMIC | 1 refills | Status: DC

## 2016-06-20 NOTE — ED Triage Notes (Signed)
[facility] a [facility] pain and swelling to her right eye that she believed to be a possible stye.

## 2016-06-20 NOTE — ED Provider Notes (Signed)
CSN: [id number]     Arrival date & time 06/20/16  1017 History   First MD Initiated Contact with Patient 06/20/16 1200     Chief Complaint  Patient presents with  . Eye Problem   (Consider location/radiation/quality/duration/timing/severity/associated sxs/prior Treatment) 21 year old female patient presents to clinic with chief complaint of swelling and redness to the upper eyelid of the right eye. States this has been present for 3-4 days. Denies any visual changes, denies any blurred vision, denies any sensation of objects or lights with her eye   The history is provided by the patient.  Eye Problem    Past Medical History:  Diagnosis Date  . Anemia    Past Surgical History:  Procedure Laterality Date  . WISDOM TOOTH EXTRACTION     Family History  Problem Relation Age of Onset  . Anemia Mother   . Depression Mother   . Diabetes Mother   . Anemia Maternal Grandmother    Social History  Substance Use Topics  . Smoking status: Never Smoker  . Smokeless tobacco: Never Used  . Alcohol use No   OB History    Gravida Para Term Preterm AB Living   1 1 1     1    SAB TAB Ectopic Multiple Live Births         0 1     Review of Systems  Reason unable to perform ROS: as covered in HPI.  All other systems reviewed and are negative.   Allergies  Patient has no known allergies.  Home Medications   Prior to Admission medications   Medication Sig Start Date End Date Taking? Authorizing Provider  Prenatal Vit-Fe Fumarate-FA (PRENATAL MULTIVITAMIN) TABS tablet Take 1 tablet by mouth daily at 12 noon.   Yes Historical Provider, MD  erythromycin ophthalmic ointment Place a 1/2 inch ribbon of ointment into the lower eyelid. 06/20/16   Dorena Bodo, NP   Meds Ordered and Administered this Visit  Medications - No data to display  BP 102/67 (BP Location: Right Arm)   Pulse 85   Temp 97.8 F (36.6 C) (Oral)   Resp 18   SpO2 99%   Breastfeeding? Yes  No data  found.   Physical Exam  Constitutional: She is oriented to person, place, and time. She appears well-developed and well-nourished. No distress.  HENT:  Head: Normocephalic and atraumatic.  Right Ear: External ear normal.  Left Ear: External ear normal.  Eyes: Conjunctivae and EOM are normal. Pupils are equal, round, and reactive to light. Right eye exhibits no discharge. Left eye exhibits no discharge.  Right upper eyelid hordeolum present, no evidence of cellulitis, or other systemic concerns.  Neurological: She is alert and oriented to person, place, and time.  Skin: Skin is warm and dry. Capillary refill takes less than 2 seconds. She is not diaphoretic.  Psychiatric: She has a normal mood and affect.  Nursing note and vitals reviewed.   Urgent Care Course     Procedures (including critical care time)  Labs Review Labs Reviewed - No data to display  Imaging Review No results found.   Visual Acuity Review  Right Eye Distance:   Left Eye Distance:   Bilateral Distance:    Right Eye Near:   Left Eye Near:    Bilateral Near:         MDM   1. Hordeolum externum of right upper eyelid   You have a Hordeolum, also known as a Stye. I have prescribed an antibiotic  ointment called erythromycin. Apply 1/2 inch strip twice daily as needed. You may also apply a warm compress 15 minutes at a time 4 times a day. Should you fail to have any relief, I would recommend you follow up with an ophthalmologist for evaluation.      Dorena BodoLawrence Khaleesi Gruel, NP 06/20/16 1210

## 2016-06-20 NOTE — Discharge Instructions (Signed)
You have a Hordeolum, also known as a Stye. I have prescribed an antibiotic ointment called erythromycin. Apply 1/2 inch strip twice daily as needed. You may also apply a warm compress 15 minutes at a time 4 times a day. Should you fail to have any relief, I would recommend you follow up with an ophthalmologist for evaluation.

## 2016-06-20 NOTE — ED Notes (Signed)
Visual  Acuity   20/  15    Right    20/20 in the  Left

## 2016-06-28 ENCOUNTER — Encounter: Payer: Self-pay | Admitting: Student

## 2016-06-28 ENCOUNTER — Ambulatory Visit (INDEPENDENT_AMBULATORY_CARE_PROVIDER_SITE_OTHER): Payer: Medicaid Other | Admitting: Student

## 2016-06-28 ENCOUNTER — Other Ambulatory Visit (HOSPITAL_COMMUNITY)
Admission: RE | Admit: 2016-06-28 | Discharge: 2016-06-28 | Disposition: A | Discharge status: Home / Self Care | Payer: Medicaid Other | Source: Ambulatory Visit | Attending: Student | Admitting: Student

## 2016-06-28 DIAGNOSIS — Z01419 Encounter for gynecological examination (general) (routine) without abnormal findings: Secondary | ICD-10-CM | POA: Insufficient documentation

## 2016-06-28 DIAGNOSIS — Z3202 Encounter for pregnancy test, result negative: Secondary | ICD-10-CM

## 2016-06-28 DIAGNOSIS — K5900 Constipation, unspecified: Secondary | ICD-10-CM | POA: Diagnosis not present

## 2016-06-28 DIAGNOSIS — Z30430 Encounter for insertion of intrauterine contraceptive device: Secondary | ICD-10-CM

## 2016-06-28 LAB — POCT PREGNANCY, URINE: Preg Test, Ur: NEGATIVE

## 2016-06-28 MED ORDER — DOCUSATE SODIUM 100 MG PO CAPS: 100.0000 mg | ORAL_CAPSULE | Freq: Two times a day (BID) | ORAL | 0 refills | Status: DC

## 2016-06-28 MED ORDER — PARAGARD INTRAUTERINE COPPER IU IUD
INTRAUTERINE_SYSTEM | Freq: Once | INTRAUTERINE | Status: AC
  Administered 2016-06-28: 16:00:00 via INTRAUTERINE

## 2016-06-28 NOTE — Patient Instructions (Signed)
Intrauterine Device Insertion, Care After This sheet gives you information about how to care for yourself after your procedure. Your health care provider may also give you more specific instructions. If you have problems or questions, contact your health care provider. What can I expect after the procedure? After the procedure, it is common to have:  Cramps and pain in the abdomen.  Light bleeding (spotting) or heavier bleeding that is like your menstrual period. This may last for up to a few days.  Lower back pain.  Dizziness.  Headaches.  Nausea. Follow these instructions at home:  Before resuming sexual activity, check to make sure that you can feel the IUD string(s). You should be able to feel the end of the string(s) below the opening of your cervix. If your IUD string is in place, you may resume sexual activity.  If you had a hormonal IUD inserted more than 7 days after your most recent period started, you will need to use a backup method of birth control for 7 days after IUD insertion. Ask your health care provider whether this applies to you.  Continue to check that the IUD is still in place by feeling for the string(s) after every menstrual period, or once a month.  Take over-the-counter and prescription medicines only as told by your health care provider.  Do not drive or use heavy machinery while taking prescription pain medicine.  Keep all follow-up visits as told by your health care provider. This is important. Contact a health care provider if:  You have bleeding that is heavier or lasts longer than a normal menstrual cycle.  You have a fever.  You have cramps or abdominal pain that get worse or do not get better with medicine.  You develop abdominal pain that is new or is not in the same area of earlier cramping and pain.  You feel lightheaded or weak.  You have abnormal or bad-smelling discharge from your vagina.  You have pain during sexual activity.  You  have any of the following problems with your IUD string(s):  The string bothers or hurts you or your sexual partner.  You cannot feel the string.  The string has gotten longer.  You can feel the IUD in your vagina.  You think you may be pregnant, or you miss your menstrual period.  You think you may have an STI (sexually transmitted infection). Get help right away if:  You have flu-like symptoms.  You have a fever and chills.  You can feel that your IUD has slipped out of place. Summary  After the procedure, it is common to have cramps and pain in the abdomen. It is also common to have light bleeding (spotting) or heavier bleeding that is like your menstrual period.  Continue to check that the IUD is still in place by feeling for the string(s) after every menstrual period, or once a month.  Keep all follow-up visits as told by your health care provider. This is important.  Contact your health care provider if you have problems with your IUD string(s), such as the string getting longer or bothering you or your sexual partner. This information is not intended to replace advice given to you by your health care provider. Make sure you discuss any questions you have with your health care provider. Document Released: 12/15/2010 Document Revised: 03/09/2016 Document Reviewed: 03/09/2016 Elsevier Interactive Patient Education  2017 [employer]. Intrauterine Device Information An intrauterine device (IUD) is inserted into your uterus to prevent  pregnancy. There are two types of IUDs available:  Copper IUD-This type of IUD is wrapped in copper wire and is placed inside the uterus. Copper makes the uterus and fallopian tubes produce a fluid that kills sperm. The copper IUD can stay in place for 10 years.  Hormone IUD-This type of IUD contains the hormone progestin (synthetic progesterone). The hormone thickens the cervical mucus and prevents sperm from entering the uterus. It also thins  the uterine lining to prevent implantation of a fertilized egg. The hormone can weaken or kill the sperm that get into the uterus. One type of hormone IUD can stay in place for 5 years, and another type can stay in place for 3 years. Your health care provider will make sure you are a good candidate for a contraceptive IUD. Discuss with your health care provider the possible side effects. Advantages of an intrauterine device  IUDs are highly effective, reversible, long acting, and low maintenance.  There are no estrogen-related side effects.  An IUD can be used when breastfeeding.  IUDs are not associated with weight gain.  The copper IUD works immediately after insertion.  The hormone IUD works right away if inserted within 7 days of your period starting. You will need to use a backup method of birth control for 7 days if the hormone IUD is inserted at any other time in your cycle.  The copper IUD does not interfere with your female hormones.  The hormone IUD can make heavy menstrual periods lighter and decrease cramping.  The hormone IUD can be used for 3 or 5 years.  The copper IUD can be used for 10 years. Disadvantages of an intrauterine device  The hormone IUD can be associated with irregular bleeding patterns.  The copper IUD can make your menstrual flow heavier and more painful.  You may experience cramping and vaginal bleeding after insertion. This information is not intended to replace advice given to you by your health care provider. Make sure you discuss any questions you have with your health care provider. Document Released: 03/22/2004 Document Revised: 09/24/2015 Document Reviewed: 10/07/2012 Elsevier Interactive Patient Education  2017 [employer] Constipation, Adult Constipation is when a person has fewer bowel movements in a week than normal, has difficulty having a bowel movement, or has stools that are dry, hard, or larger than normal. Constipation may be  caused by an underlying condition. It may become worse with age if a person takes certain medicines and does not take in enough fluids. Follow these instructions at home: Eating and drinking    Eat foods that have a lot of fiber, such as fresh fruits and vegetables, whole grains, and beans.  Limit foods that are high in fat, low in fiber, or overly processed, such as french fries, hamburgers, cookies, candies, and soda.  Drink enough fluid to keep your urine clear or pale yellow. General instructions   Exercise regularly or as told by your health care provider.  Go to the restroom when you have the urge to go. Do not hold it in.  Take over-the-counter and prescription medicines only as told by your health care provider. These include any fiber supplements.  Practice pelvic floor retraining exercises, such as deep breathing while relaxing the lower abdomen and pelvic floor relaxation during bowel movements.  Watch your condition for any changes.  Keep all follow-up visits as told by your health care provider. This is important. Contact a health care provider if:  You have pain that gets  worse.  You have a fever.  You do not have a bowel movement after 4 days.  You vomit.  You are not hungry.  You lose weight.  You are bleeding from the anus.  You have thin, pencil-like stools. Get help right away if:  You have a fever and your symptoms suddenly get worse.  You leak stool or have blood in your stool.  Your abdomen is bloated.  You have severe pain in your abdomen.  You feel dizzy or you faint. This information is not intended to replace advice given to you by your health care provider. Make sure you discuss any questions you have with your health care provider. Document Released: 01/15/2004 Document Revised: 11/06/2015 Document Reviewed: 10/07/2015 Elsevier Interactive Patient Education  2017 [employer].

## 2016-06-28 NOTE — Progress Notes (Addendum)
Subjective:     Sally LouisJihan Poblete is a 21 y.o. female who presents for a postpartum visit. She is 5 weeks postpartum following a spontaneous vaginal delivery. I have fully reviewed the prenatal and intrapartum course. The delivery was at 39 gestational weeks. Outcome: spontaneous vaginal delivery. Anesthesia: epidural. Postpartum course has been normal. Baby's course has been normal. Baby is feeding by breast. Bleeding no bleeding. Bowel function is normal. Bladder function is normal. Patient is not sexually active. Contraception method is IUD. Postpartum depression screening: negative.   The following portions of the patient's history were reviewed and updated as appropriate: allergies, current medications, past family history, past medical history, past social history, past surgical history and problem list.  Review of Systems Pertinent items are noted in HPI.   Objective:    BP 108/64   Pulse 85   Ht 5\' 4"  (1.626 m)   Wt 188 lb 1.6 oz (85.3 kg)   Breastfeeding? Yes   BMI 32.29 kg/m   General:  alert, cooperative, appears stated age and moderately obese  Lungs: clear to auscultation bilaterally  Heart:  regular rate and rhythm, S1, S2 normal, no murmur, click, rub or gallop  Abdomen: soft, non-tender; bowel sounds normal; no masses,  no organomegaly   Vulva:  normal  Vagina: normal vagina, no discharge, exudate, lesion, or erythema  Cervix:  multiparous appearance, no lesions and some spotting after pap        Assessment:     normal postpartum exam. Pap smear done at today's visit.     IUD Insertion Procedure Note Patient identified, informed consent performed, consent signed.   Discussed risks of irregular bleeding, cramping, infection, malpositioning or misplacement of the IUD outside the uterus which may require further procedure such as laparoscopy. Time out was performed.  Urine pregnancy test negative.  Speculum placed in the vagina.  Cervix visualized.  Cleaned with Betadine x  2.  Grasped anteriorly with a single tooth tenaculum.  Uterus sounded to 6 cm.  Paragard IUD placed per manufacturer's recommendations.  Strings trimmed to 3 cm. Tenaculum was removed, good hemostasis noted.  Patient tolerated procedure well.   Patient was given post-procedure instructions.  She was advised to have backup contraception for one week.  Patient was also asked to check IUD strings periodically and follow up in 4 weeks for IUD check.   Plan:   1. Encounter for routine postpartum follow-up  - Cytology - PAP - PARAGARD INTRAUTERINE COPPER IUD; by Intrauterine route once.  2. Constipation, unspecified constipation type  - docusate sodium (COLACE) 100 MG capsule; Take 1 capsule (100 mg total) by mouth 2 (two) times daily.  Dispense: 60 capsule; Refill: 0 - Cytology - PAP  3. Encounter for insertion of intrauterine contraceptive device (IUD)  - Cytology - PAP - PARAGARD INTRAUTERINE COPPER IUD; by Intrauterine route once.

## 2016-06-30 LAB — CYTOLOGY - PAP: DIAGNOSIS: NEGATIVE

## 2016-08-01 ENCOUNTER — Ambulatory Visit: Payer: Medicaid Other | Admitting: Student

## 2016-08-09 ENCOUNTER — Ambulatory Visit: Payer: Medicaid Other | Admitting: Advanced Practice Midwife

## 2016-08-10 ENCOUNTER — Ambulatory Visit: Payer: Medicaid Other | Admitting: Medical

## 2017-06-10 ENCOUNTER — Other Ambulatory Visit: Payer: Self-pay

## 2017-06-10 ENCOUNTER — Inpatient Hospital Stay (HOSPITAL_COMMUNITY)
Admission: AD | Admit: 2017-06-10 | Discharge: 2017-06-10 | Disposition: A | Discharge status: Home / Self Care | Payer: Medicaid Other | Source: Ambulatory Visit | Attending: Obstetrics and Gynecology | Admitting: Obstetrics and Gynecology

## 2017-06-10 ENCOUNTER — Encounter (HOSPITAL_COMMUNITY): Payer: Self-pay | Admitting: *Deleted

## 2017-06-10 DIAGNOSIS — R102 Pelvic and perineal pain: Secondary | ICD-10-CM | POA: Insufficient documentation

## 2017-06-10 DIAGNOSIS — B3731 Acute candidiasis of vulva and vagina: Secondary | ICD-10-CM

## 2017-06-10 DIAGNOSIS — D649 Anemia, unspecified: Secondary | ICD-10-CM | POA: Insufficient documentation

## 2017-06-10 DIAGNOSIS — B373 Candidiasis of vulva and vagina: Secondary | ICD-10-CM | POA: Insufficient documentation

## 2017-06-10 DIAGNOSIS — Z3202 Encounter for pregnancy test, result negative: Secondary | ICD-10-CM

## 2017-06-10 DIAGNOSIS — N898 Other specified noninflammatory disorders of vagina: Secondary | ICD-10-CM | POA: Insufficient documentation

## 2017-06-10 LAB — URINALYSIS, ROUTINE W REFLEX MICROSCOPIC
Bilirubin Urine: NEGATIVE
Glucose, UA: NEGATIVE mg/dL
Hgb urine dipstick: NEGATIVE
KETONES UR: NEGATIVE mg/dL
Leukocytes, UA: NEGATIVE
NITRITE: NEGATIVE
PH: 5 (ref 5.0–8.0)
Protein, ur: NEGATIVE mg/dL
SPECIFIC GRAVITY, URINE: 1.024 (ref 1.005–1.030)

## 2017-06-10 LAB — WET PREP, GENITAL
CLUE CELLS WET PREP: NONE SEEN
Sperm: NONE SEEN
Trich, Wet Prep: NONE SEEN

## 2017-06-10 LAB — POCT PREGNANCY, URINE: PREG TEST UR: NEGATIVE

## 2017-06-10 MED ORDER — NYSTATIN 100000 UNIT/GM EX CREA
TOPICAL_CREAM | CUTANEOUS | 0 refills | Status: DC
Start: 2017-06-10 — End: 2018-02-16

## 2017-06-10 MED ORDER — FLUCONAZOLE 150 MG PO TABS
150.0000 mg | ORAL_TABLET | Freq: Once | ORAL | Status: AC
  Administered 2017-06-10: 150 mg via ORAL
  Filled 2017-06-10: qty 1

## 2017-06-10 MED ORDER — FLUCONAZOLE 150 MG PO TABS: 150.0000 mg | ORAL_TABLET | Freq: Every day | ORAL | 0 refills | Status: DC

## 2017-06-10 MED ORDER — TRIAMCINOLONE ACETONIDE 0.1 % EX CREA: 1.0000 "application " | TOPICAL_CREAM | Freq: Two times a day (BID) | CUTANEOUS | 0 refills | Status: DC

## 2017-06-10 NOTE — MAU Provider Note (Signed)
History     CSN: [id number]  Arrival date and time: 06/10/17 2019   First Provider Initiated Contact with Patient 06/10/17 2059      Chief Complaint  Patient presents with  . Vaginal Discharge   HPI Sally Shannon is a 22 y.o. G1P1001 non pregnant female who presents with vaginal discharge and irritation. Symptoms began 3 days ago. Reports green/yellow discharge with clumpy component. Has been burning and itching. Tried OTC 1 day monistat without relief. No change to soaps or detergent. In monogamous relationship x 3 years. Denies hx of STD. Has paragard IUD for contraception. Denies abdominal pain, fever/chills, or vaginal bleeding.   Past Medical History:  Diagnosis Date  . Anemia     Past Surgical History:  Procedure Laterality Date  . WISDOM TOOTH EXTRACTION      Family History  Problem Relation Age of Onset  . Anemia Mother   . Depression Mother   . Diabetes Mother   . Anemia Maternal Grandmother     Social History   Tobacco Use  . Smoking status: Never Smoker  . Smokeless tobacco: Never Used  Substance Use Topics  . Alcohol use: No    Alcohol/week: 0.0 oz  . Drug use: No    Allergies: No Known Allergies  Medications Prior to Admission  Medication Sig Dispense Refill Last Dose  . docusate sodium (COLACE) 100 MG capsule Take 1 capsule (100 mg total) by mouth 2 (two) times daily. 60 capsule 0   . erythromycin ophthalmic ointment Place a 1/2 inch ribbon of ointment into the lower eyelid. (Patient not taking: Reported on 06/28/2016) 1 g 1 Not Taking  . Prenatal Vit-Fe Fumarate-FA (PRENATAL MULTIVITAMIN) TABS tablet Take 1 tablet by mouth daily at 12 noon.   Taking    Review of Systems  Constitutional: Negative.   Gastrointestinal: Negative.   Genitourinary: Positive for vaginal discharge and vaginal pain. Negative for dyspareunia, dysuria, genital sores and vaginal bleeding.   Physical Exam   Blood pressure 121/73, pulse 87, temperature (!) 97.5 F (36.4 C),  resp. rate 18, height 5\' 4"  (1.626 m), weight 183 lb (83 kg), last menstrual period 05/20/2017, currently breastfeeding.  Physical Exam  Nursing note and vitals reviewed. Constitutional: She is oriented to person, place, and time. She appears well-developed and well-nourished. No distress.  HENT:  Head: Normocephalic and atraumatic.  Eyes: Conjunctivae are normal. Right eye exhibits no discharge. Left eye exhibits no discharge. No scleral icterus.  Neck: Normal range of motion.  Respiratory: Effort normal. No respiratory distress.  Genitourinary: There is no lesion on the right labia. There is no lesion on the left labia. Cervix exhibits no motion tenderness and no friability. There is erythema in the vagina. No bleeding in the vagina. Vaginal discharge (moderate amount of discharge, white clumpy discharge & clear mucoid discharge) found.  Neurological: She is alert and oriented to person, place, and time.  Skin: Skin is warm and dry. She is not diaphoretic.  Psychiatric: She has a normal mood and affect. Her behavior is normal. Judgment and thought content normal.    MAU Course  Procedures Results for orders placed or performed during the hospital encounter of 06/10/17 (from the past 24 hour(s))  Urinalysis, Routine w reflex microscopic     Status: None   Collection Time: 06/10/17  8:35 PM  Result Value Ref Range   Color, Urine YELLOW YELLOW   APPearance CLEAR CLEAR   Specific Gravity, Urine 1.024 1.005 - 1.030   pH 5.0  5.0 - 8.0   Glucose, UA NEGATIVE NEGATIVE mg/dL   Hgb urine dipstick NEGATIVE NEGATIVE   Bilirubin Urine NEGATIVE NEGATIVE   Ketones, ur NEGATIVE NEGATIVE mg/dL   Protein, ur NEGATIVE NEGATIVE mg/dL   Nitrite NEGATIVE NEGATIVE   Leukocytes, UA NEGATIVE NEGATIVE  Wet prep, genital     Status: Abnormal   Collection Time: 06/10/17  9:10 PM  Result Value Ref Range   Yeast Wet Prep HPF POC PRESENT (A) NONE SEEN   Trich, Wet Prep NONE SEEN NONE SEEN   Clue Cells Wet  Prep HPF POC NONE SEEN NONE SEEN   WBC, Wet Prep HPF POC MODERATE BACTERIA SEEN (A) NONE SEEN   Sperm NONE SEEN   Pregnancy, urine POC     Status: None   Collection Time: 06/10/17  9:18 PM  Result Value Ref Range   Preg Test, Ur NEGATIVE NEGATIVE    MDM UPT negative Wet prep + yeast Diflucan 150 mg PO prior to discharge Assessment and Plan  A: 1. Vaginal yeast infection   2. Pregnancy examination or test, negative result    P: Discharge home Rx diflucan Rx nystatin & triamcinolone for external use GC/CT pending F/u with PCP as needed  Judeth HornErin Bintou Lafata 06/10/2017, 9:00 PM

## 2017-06-10 NOTE — MAU Note (Signed)
Has had vag d/c for 3 days. Used 1 day Monistat for yeast. Better next day. Today started having a lot of burning and feels like cuts on vagina. Put finger in vagina and feels like "bumps". Have had yeast and BV in past and discomfort is the same. Some white/green d/c with itching

## 2017-06-10 NOTE — Progress Notes (Signed)
Written and verbal d/c instructions given and understanding voiced. 

## 2017-06-10 NOTE — Discharge Instructions (Signed)

## 2017-06-12 LAB — GC/CHLAMYDIA PROBE AMP (~~LOC~~) NOT AT ARMC
CHLAMYDIA, DNA PROBE: NEGATIVE
Neisseria Gonorrhea: NEGATIVE

## 2017-06-21 ENCOUNTER — Ambulatory Visit (INDEPENDENT_AMBULATORY_CARE_PROVIDER_SITE_OTHER): Payer: Medicaid Other | Admitting: Family Medicine

## 2017-06-21 ENCOUNTER — Encounter: Payer: Self-pay | Admitting: Family Medicine

## 2017-06-21 DIAGNOSIS — G8929 Other chronic pain: Secondary | ICD-10-CM | POA: Diagnosis not present

## 2017-06-21 DIAGNOSIS — M25561 Pain in right knee: Secondary | ICD-10-CM | POA: Insufficient documentation

## 2017-06-21 NOTE — Assessment & Plan Note (Signed)
exam reassuring. Consistent with patellofemoral syndrome.  Reviewed home exercises to do daily.  Encouraged arch supports.  Icing, tylenol, ibuprofen or aleve if needed.  F/u in 6 weeks.  Avoid deep squats, lunges.

## 2017-06-21 NOTE — Progress Notes (Signed)
PCP: Sally Shannon, Kate, MD  Subjective:   HPI: Patient is a 22 y.o. female here for right knee pain.  Patient reports she's had about 1 week of anterior right knee pain. No acute injury or trauma. Had similar pain about a year ago when she was pregnant that resolved soon after she had her child. Pain past week has been sharp with burning. Started working out about a month ago with this coming on in past week. Tried a brace but it was too big. Massage did not help. Some swelling. Not taking any medicines for this. No skin changes, numbness.  Past Medical History:  Diagnosis Date  . Anemia     Current Outpatient Medications on File Prior to Visit  Medication Sig Dispense Refill  . docusate sodium (COLACE) 100 MG capsule Take 1 capsule (100 mg total) by mouth 2 (two) times daily. 60 capsule 0  . erythromycin ophthalmic ointment Place a 1/2 inch ribbon of ointment into the lower eyelid. (Patient not taking: Reported on 06/28/2016) 1 g 1  . fluconazole (DIFLUCAN) 150 MG tablet Take 1 tablet (150 mg total) by mouth daily. 1 tablet 0  . nystatin cream (MYCOSTATIN) Apply to affected area 2 times daily 15 g 0  . Prenatal Vit-Fe Fumarate-FA (PRENATAL MULTIVITAMIN) TABS tablet Take 1 tablet by mouth daily at 12 noon.    . triamcinolone cream (KENALOG) 0.1 % Apply 1 application topically 2 (two) times daily. 15 g 0   No current facility-administered medications on file prior to visit.     Past Surgical History:  Procedure Laterality Date  . WISDOM TOOTH EXTRACTION      No Known Allergies  Social History   Socioeconomic History  . Marital status: Single    Spouse name: Not on file  . Number of children: Not on file  . Years of education: Not on file  . Highest education level: Not on file  Social Needs  . Financial resource strain: Not on file  . Food insecurity - worry: Not on file  . Food insecurity - inability: Not on file  . Transportation needs - medical: Not on file  .  Transportation needs - non-medical: Not on file  Occupational History  . Not on file  Tobacco Use  . Smoking status: Never Smoker  . Smokeless tobacco: Never Used  Substance and Sexual Activity  . Alcohol use: No    Alcohol/week: 0.0 oz  . Drug use: No  . Sexual activity: Yes    Birth control/protection: IUD  Other Topics Concern  . Not on file  Social History Narrative  . Not on file    Family History  Problem Relation Age of Onset  . Anemia Mother   . Depression Mother   . Diabetes Mother   . Anemia Maternal Grandmother     BP 116/60   Ht 5\' 4"  (1.626 m)   Wt 180 lb (81.6 kg)   BMI 30.90 kg/m   Review of Systems: See HPI above.     Objective:  Physical Exam:  Gen: NAD, comfortable in exam room  Right knee: Pes planus. No gross deformity, ecchymoses, effusion. VMO atrophy. No TTP currently. FROM with 5/5 strength including hip abduction. Negative ant/post drawers. Negative valgus/varus testing. Negative lachmanns. Negative mcmurrays, apleys, patellar apprehension. NV intact distally.  Left knee: No deformity. FROM with 5/5 strength. No tenderness to palpation. NVI distally.   Assessment & Plan:  1. Right knee pain - exam reassuring. Consistent with patellofemoral syndrome.  Reviewed home exercises to do daily.  Encouraged arch supports.  Icing, tylenol, ibuprofen or aleve if needed.  F/u in 6 weeks.  Avoid deep squats, lunges.

## 2017-06-21 NOTE — Patient Instructions (Signed)
You have patellofemoral syndrome. Avoid painful activities when possible (often deep squats, lunges bother this). Ok for all activities though as tolerated. Straight leg raise, hip side raises, straight leg raises with foot turned outwards 3 sets of 10 once a day. Add ankle weight if these become too easy. Consider formal physical therapy. Correct foot breakdown with something like dr. Jari Sportsmanscholls active series, spencos, superfeet, or our green sports insoles. Avoid flat shoes, barefoot walking as much as possible. Icing 15 minutes at a time 3-4 times a day as needed. Tylenol 500mg  1-2 tabs three times a day if needed for pain. Ibuprofen 600mg  three times a day with food OR aleve 2 tabs twice a day with food if needed for pain. Follow up with me in 6 weeks.

## 2017-08-24 ENCOUNTER — Encounter: Payer: Self-pay | Admitting: *Deleted

## 2018-01-09 IMAGING — US US OB TRANSVAGINAL
1 series · 15 of 28 positions shown · non-contrast
Comparison: None.

CLINICAL DATA: Acute onset of pelvic cramping.  Initial encounter.

EXAM:
OBSTETRIC <14 WK US AND TRANSVAGINAL OB US
TECHNIQUE: Both transabdominal and transvaginal ultrasound examinations were
performed for complete evaluation of the gestation as well as the
maternal uterus, adnexal regions, and pelvic cul-de-sac.
Transvaginal technique was performed to assess early pregnancy.

[Series 1: us ob transvaginal · 15 of 56 slices shown]
[im 1/56]
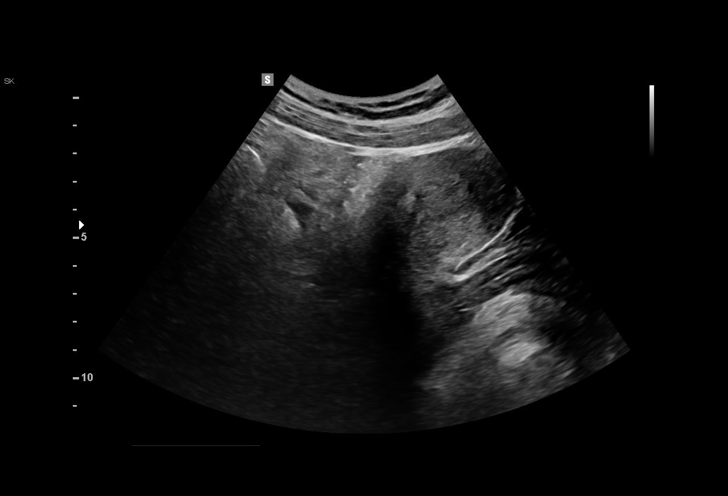
[im 5/56]
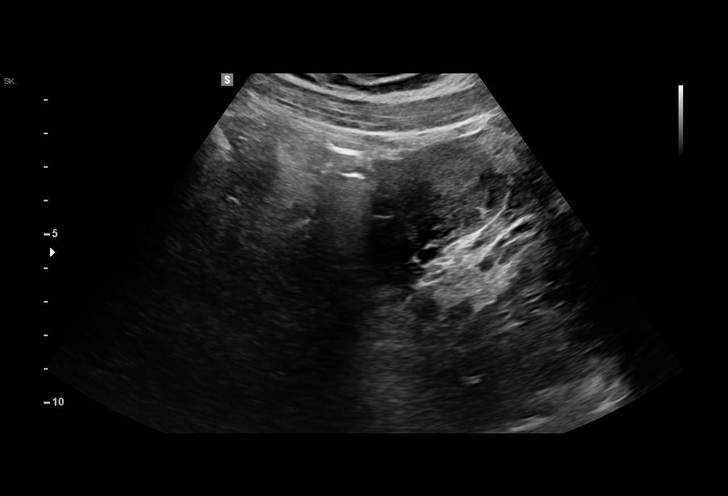
[im 9/56]
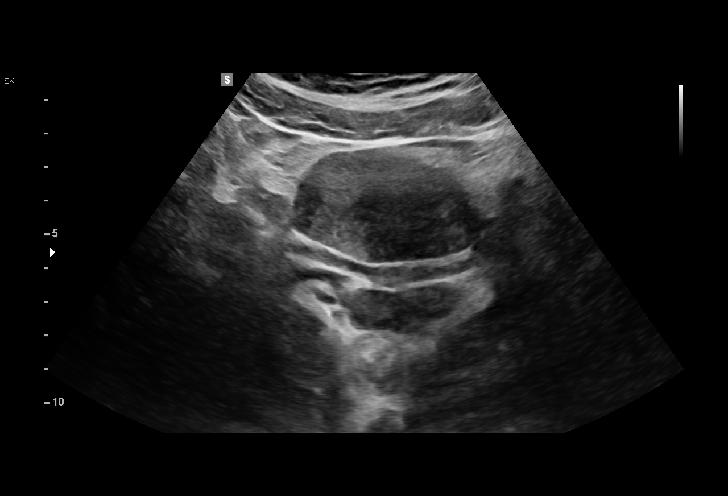
[im 13/56]
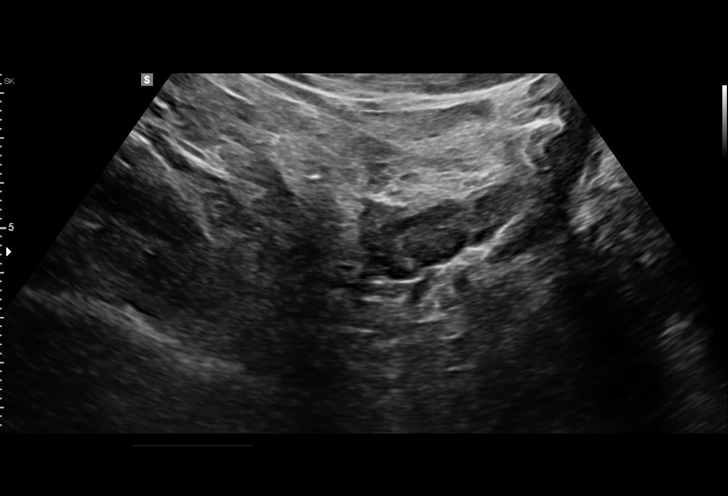
[im 17/56]
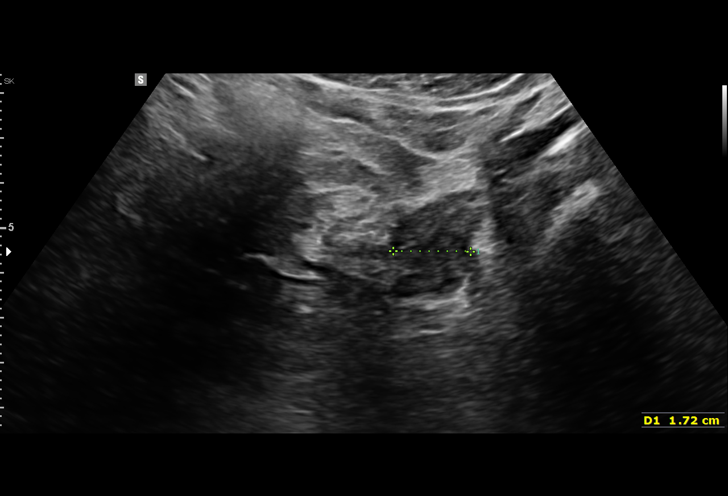
[im 21/56]
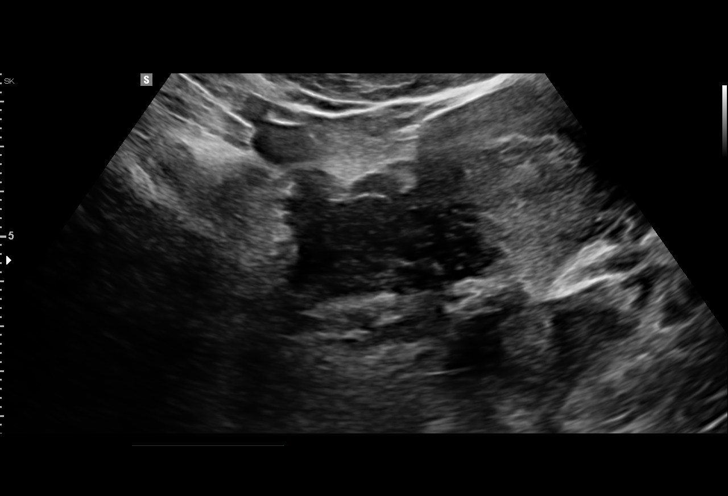
[im 25/56]
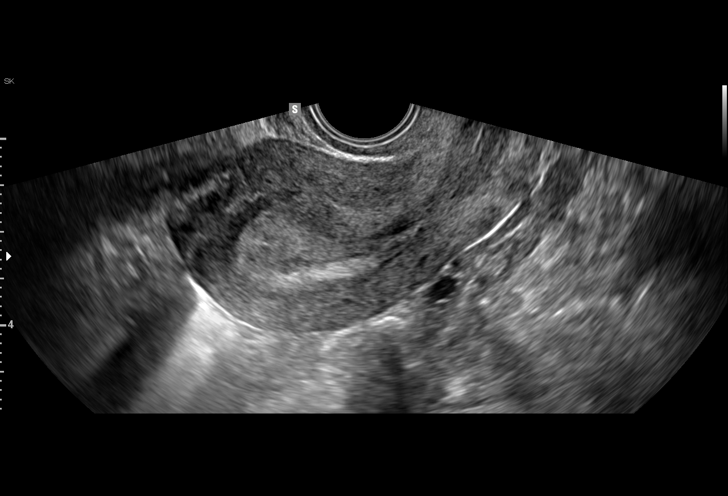
[im 29/56]
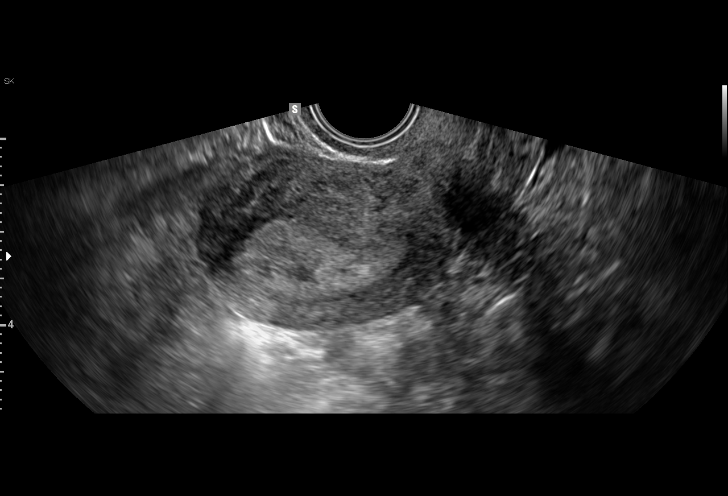
[im 31/56]
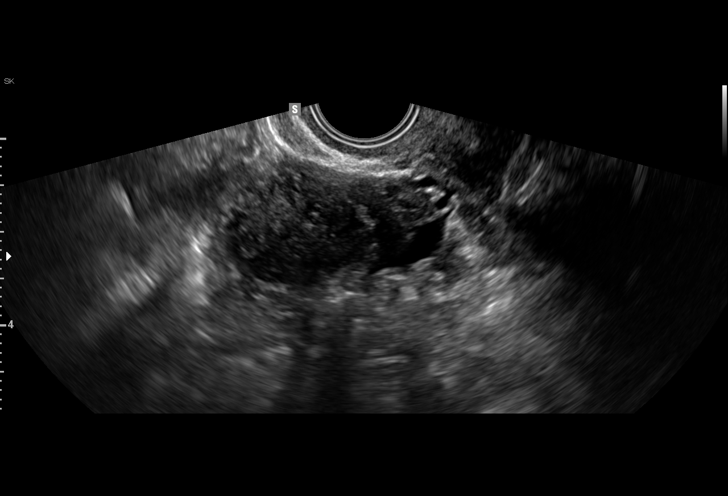
[im 35/56]
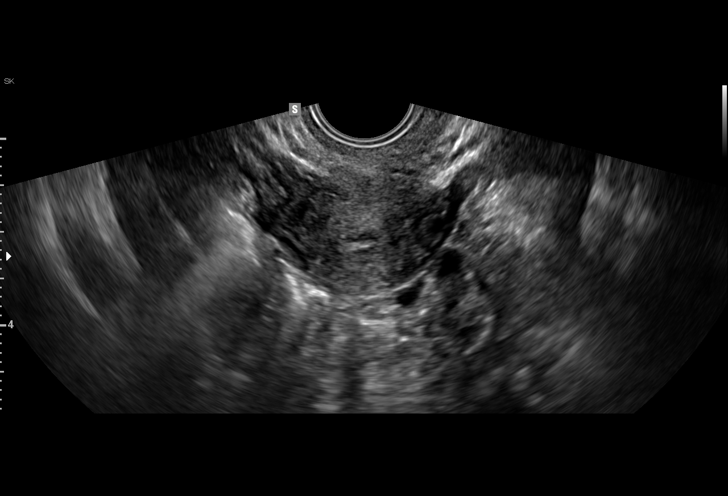
[im 39/56]
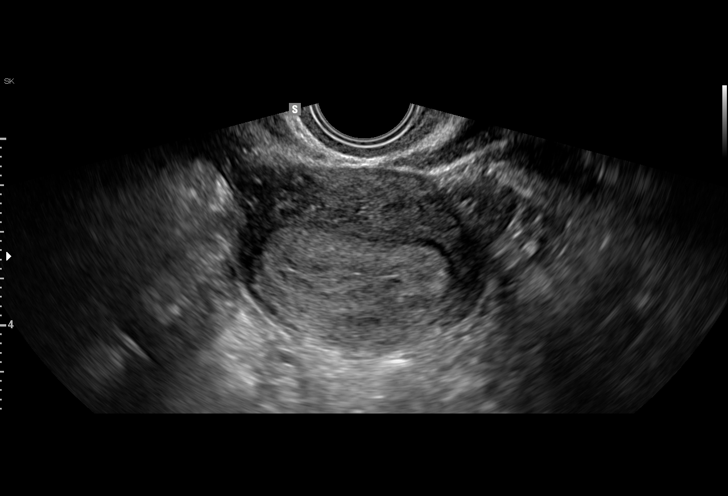
[im 43/56]
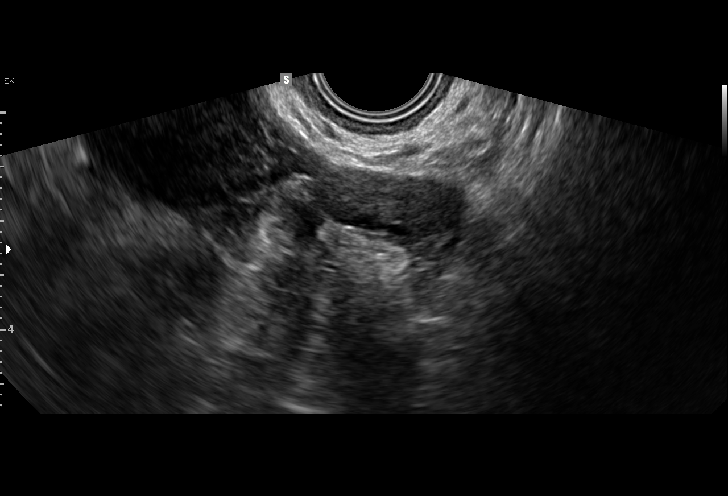
[im 47/56]
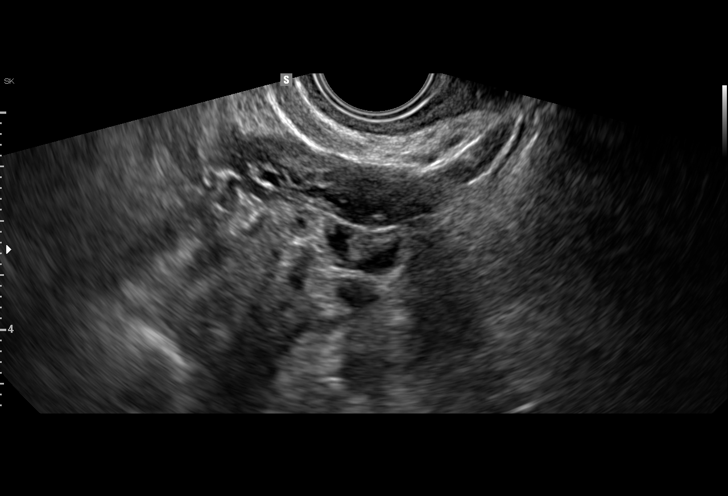
[im 51/56]
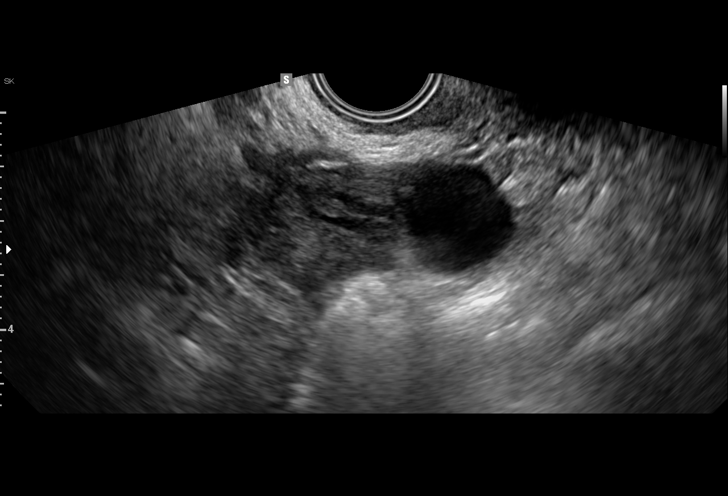
[im 56/56]
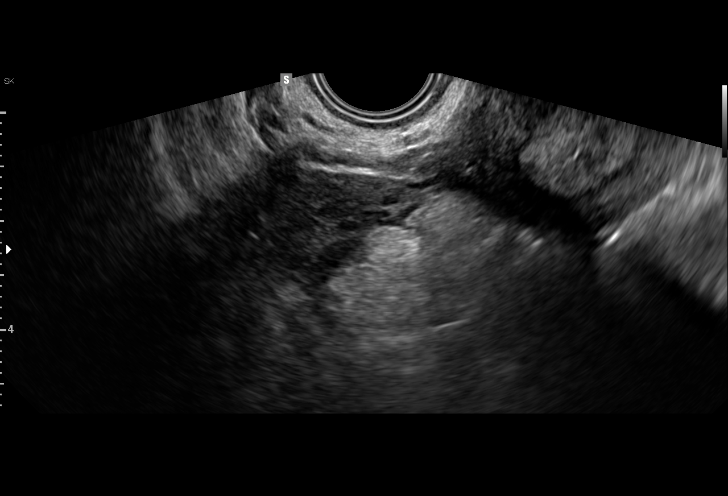

[15 of 28 positions shown; findings below may reference images not displayed]

FINDINGS: Intrauterine gestational sac: None seen.

Yolk sac:  N/A

Embryo:  N/A

Subchorionic hemorrhage:  None visualized.

Maternal uterus/adnexae: The uterus is grossly unremarkable in
appearance.

The ovaries are within normal limits. The right ovary measures 4.3 x
2.3 x 2.9 cm, while the left ovary measures 2.6 x 1.7 x 1.7 cm. No
suspicious adnexal masses are seen; there is no evidence for ovarian
torsion.

No free fluid is seen within the pelvic cul-de-sac.
IMPRESSION: No intrauterine gestational sac seen. No evidence for ectopic
pregnancy. This is somewhat unusual, given the quantitative beta HCG
level of [DATE]. If the patient's quantitative beta HCG level
continues to rise, follow-up pelvic ultrasound could be performed in
2 weeks for further evaluation.

## 2018-01-19 IMAGING — US US OB TRANSVAGINAL
1 series · 15 of 28 positions shown · non-contrast
Comparison: 09/21/2015

CLINICAL DATA: Pregnancy of unknown anatomic location. Pelvic
cramping and vaginal discharge for 2 weeks. Gestational age by LMP
of 6 weeks 1 day.

EXAM:
TRANSVAGINAL OB ULTRASOUND
TECHNIQUE: Transvaginal ultrasound was performed for complete evaluation of the
gestation as well as the maternal uterus, adnexal regions, and
pelvic cul-de-sac.

[Series 1: us ob transvaginal · 15 of 33 slices shown]
[im 1/33]
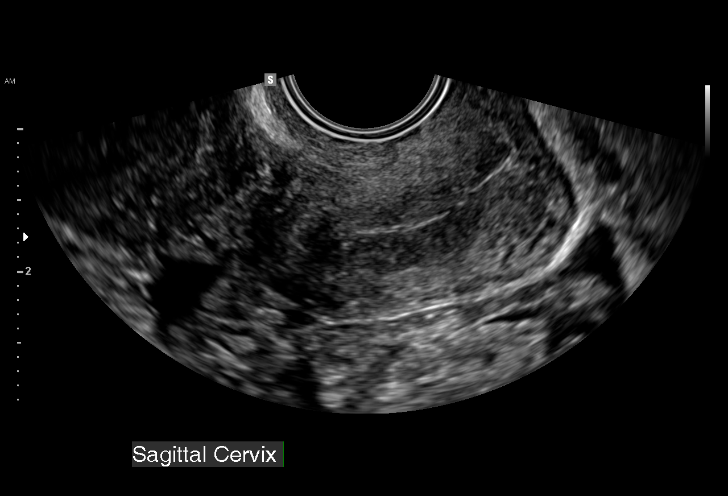
[im 3/33]
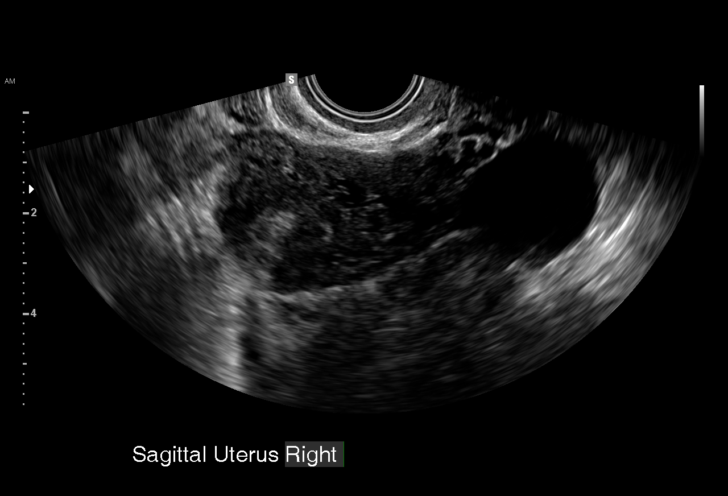
[im 5/33]
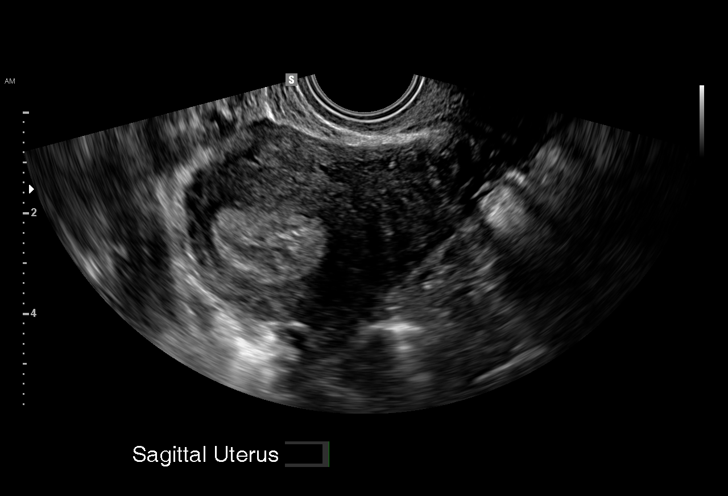
[im 8/33]
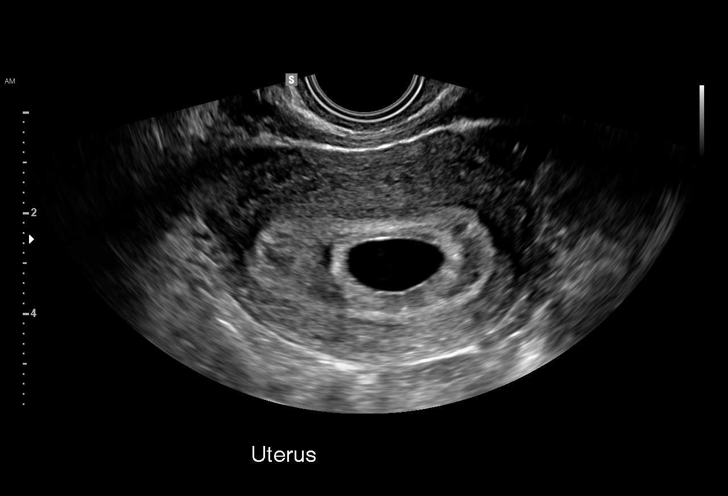
[im 10/33]
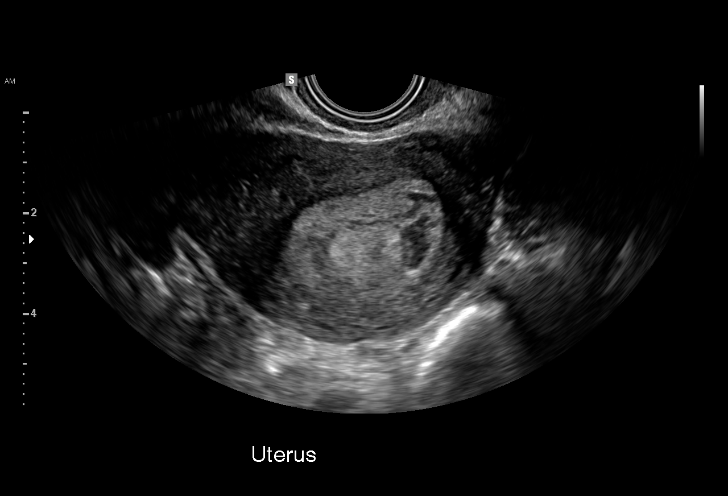
[im 12/33]
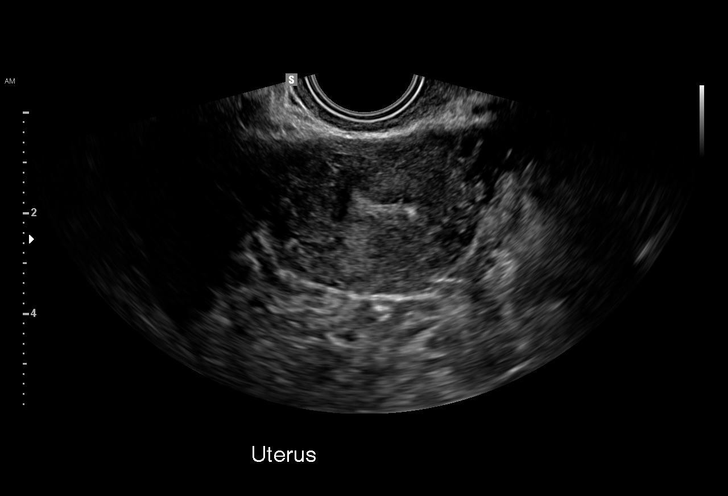
[im 15/33]
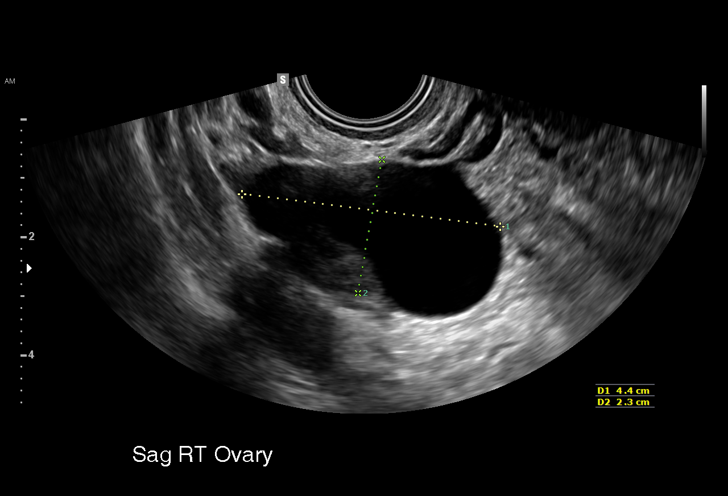
[im 17/33]
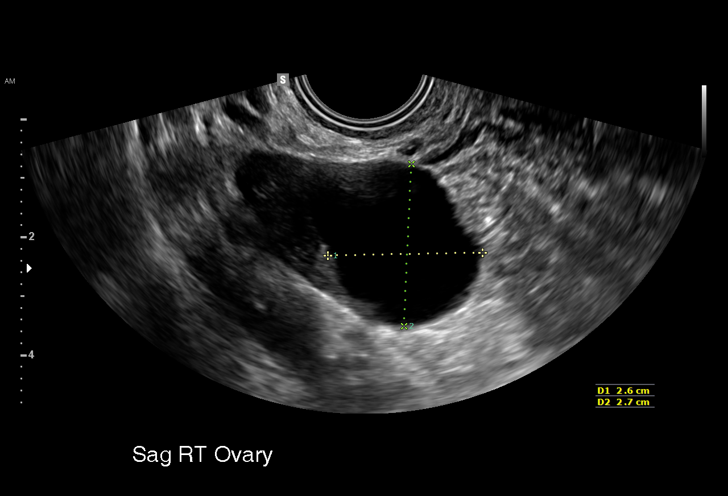
[im 18/33]
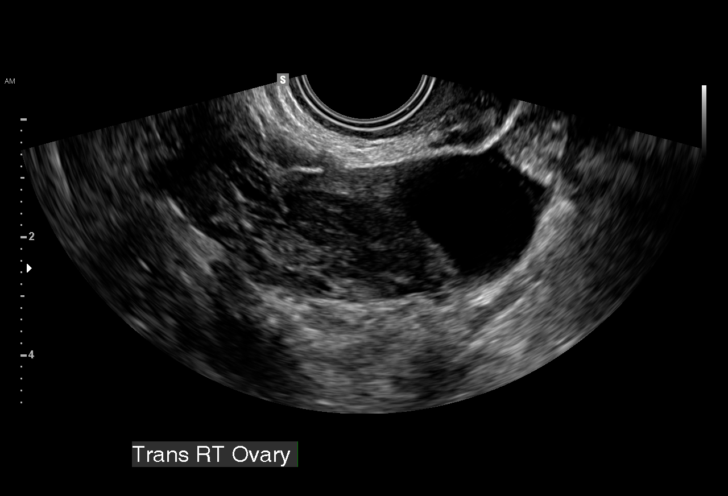
[im 21/33]
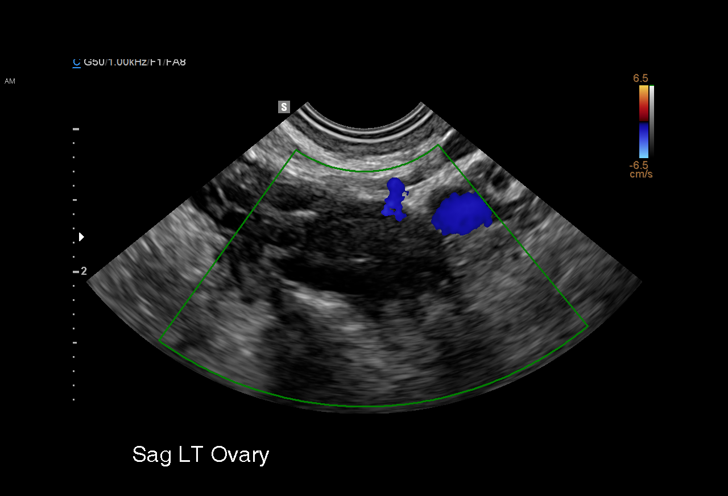
[im 23/33]
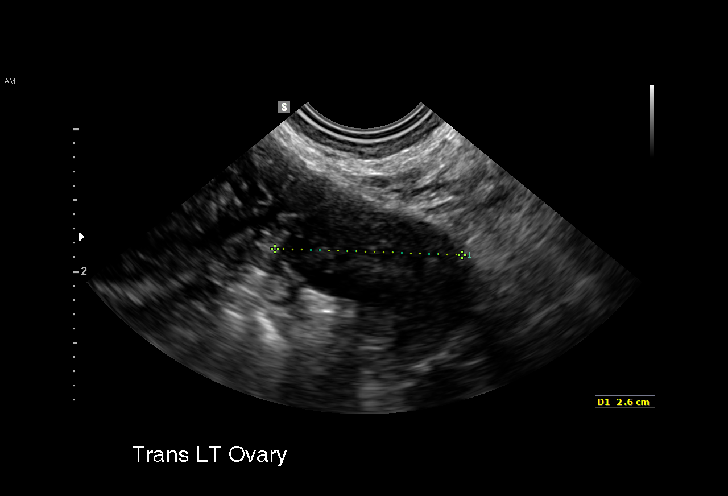
[im 25/33]
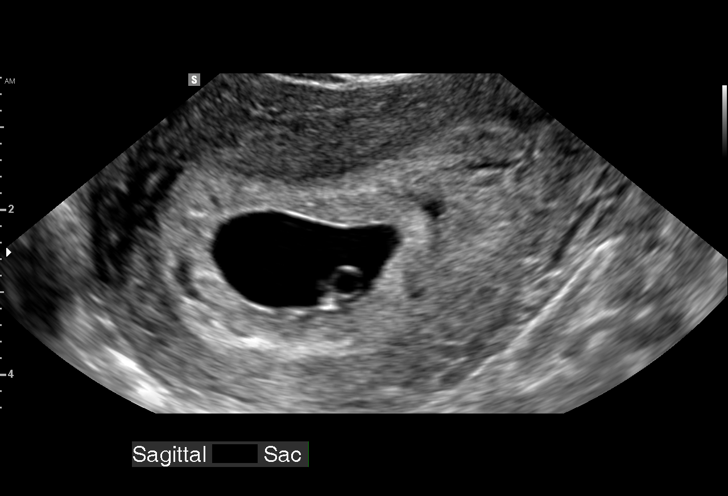
[im 28/33]
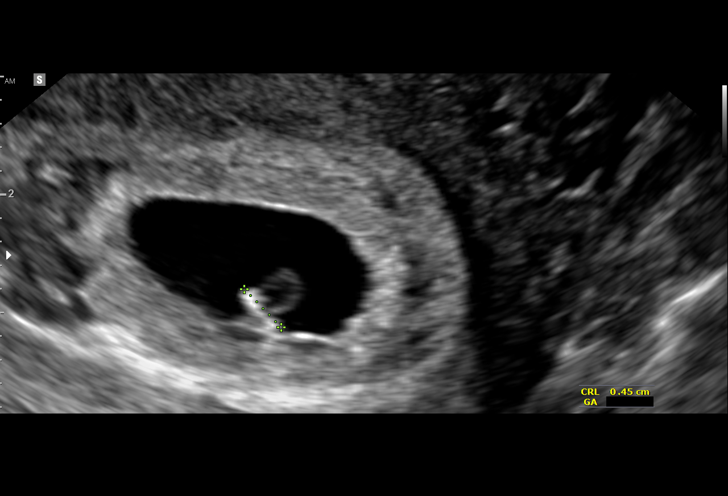
[im 30/33]
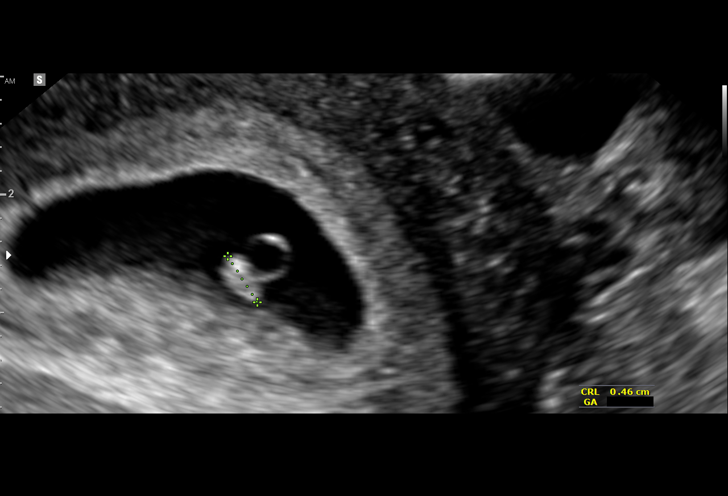
[im 33/33]
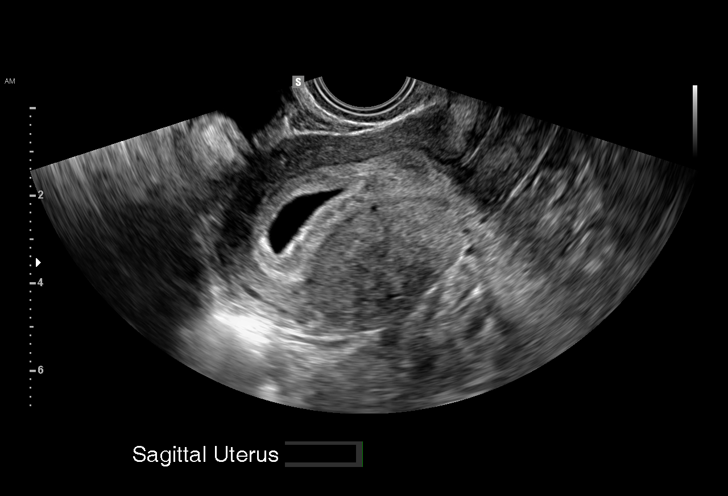

[15 of 28 positions shown; findings below may reference images not displayed]

FINDINGS: Intrauterine gestational sac: Single

Yolk sac:  Visualized

Embryo:  Visualized

Cardiac Activity: Visualized

Heart Rate: 117 bpm

CRL:   5  mm   6 w 1 d                  US EDC: 05/25/2016

Subchorionic hemorrhage:  None visualized.

Maternal uterus/adnexae: Normal appearance of left ovary. 2.7 cm
benign-appearing right ovarian cyst, probable corpus luteum. No
other adnexal mass or fluid identified.
IMPRESSION: Single living IUP measuring 6 weeks 1 day with US EDC of 05/25/2016.
This is concordant with LMP.

2.7 cm benign-appearing right ovarian corpus luteum cyst.

## 2018-02-15 ENCOUNTER — Encounter (HOSPITAL_COMMUNITY): Payer: Self-pay | Admitting: *Deleted

## 2018-02-15 ENCOUNTER — Inpatient Hospital Stay (HOSPITAL_COMMUNITY)
Admission: AD | Admit: 2018-02-15 | Discharge: 2018-02-16 | Disposition: A | Discharge status: Home / Self Care | Payer: Medicaid Other | Attending: Obstetrics and Gynecology | Admitting: Obstetrics and Gynecology

## 2018-02-15 DIAGNOSIS — S30814A Abrasion of vagina and vulva, initial encounter: Secondary | ICD-10-CM | POA: Diagnosis not present

## 2018-02-15 DIAGNOSIS — B009 Herpesviral infection, unspecified: Secondary | ICD-10-CM | POA: Diagnosis not present

## 2018-02-15 DIAGNOSIS — A6004 Herpesviral vulvovaginitis: Secondary | ICD-10-CM | POA: Diagnosis not present

## 2018-02-15 DIAGNOSIS — N898 Other specified noninflammatory disorders of vagina: Secondary | ICD-10-CM | POA: Diagnosis present

## 2018-02-15 LAB — WET PREP, GENITAL
CLUE CELLS WET PREP: NONE SEEN
Sperm: NONE SEEN
Trich, Wet Prep: NONE SEEN
YEAST WET PREP: NONE SEEN

## 2018-02-15 LAB — POCT PREGNANCY, URINE: Preg Test, Ur: NEGATIVE

## 2018-02-15 NOTE — MAU Note (Signed)
Feels like she may have a yeast infection and bacterial infection.  Discharge is thick, white, yellow and bloody.  States it's "like a cut bleeding, not period blood."  Also having internal vaginal pain and soreness.  No new sexual partners.  Had not had sex for 2 months until yesterday.  States the sex was rough and the burning sensation started during the intercourse and has gotten worse since then.

## 2018-02-15 NOTE — MAU Note (Signed)
Pregnancy test negative.  Scanned in but not transferring into Epic.

## 2018-02-16 ENCOUNTER — Other Ambulatory Visit: Payer: Self-pay

## 2018-02-16 ENCOUNTER — Inpatient Hospital Stay (EMERGENCY_DEPARTMENT_HOSPITAL)
Admission: AD | Admit: 2018-02-16 | Discharge: 2018-02-16 | Disposition: A | Discharge status: Home / Self Care | Payer: Medicaid Other | Source: Ambulatory Visit | Attending: Obstetrics and Gynecology | Admitting: Obstetrics and Gynecology

## 2018-02-16 ENCOUNTER — Encounter (HOSPITAL_COMMUNITY): Payer: Self-pay

## 2018-02-16 DIAGNOSIS — B009 Herpesviral infection, unspecified: Secondary | ICD-10-CM

## 2018-02-16 LAB — URINALYSIS, ROUTINE W REFLEX MICROSCOPIC
Bilirubin Urine: NEGATIVE
GLUCOSE, UA: NEGATIVE mg/dL
Ketones, ur: NEGATIVE mg/dL
NITRITE: NEGATIVE
PH: 5 (ref 5.0–8.0)
Protein, ur: NEGATIVE mg/dL
Specific Gravity, Urine: 1.02 (ref 1.005–1.030)

## 2018-02-16 LAB — WET PREP, GENITAL
Clue Cells Wet Prep HPF POC: NONE SEEN
SPERM: NONE SEEN
Trich, Wet Prep: NONE SEEN
YEAST WET PREP: NONE SEEN

## 2018-02-16 MED ORDER — VALACYCLOVIR HCL 1 G PO TABS: 1000.0000 mg | ORAL_TABLET | Freq: Two times a day (BID) | ORAL | 0 refills | Status: AC

## 2018-02-16 MED ORDER — VALACYCLOVIR HCL 500 MG PO TABS
1000.0000 mg | ORAL_TABLET | Freq: Once | ORAL | Status: AC
  Administered 2018-02-16: 1000 mg via ORAL
  Filled 2018-02-16: qty 2

## 2018-02-16 MED ORDER — LIDOCAINE 5 % EX OINT
TOPICAL_OINTMENT | Freq: Four times a day (QID) | CUTANEOUS | Status: DC | PRN
  Filled 2018-02-16: qty 35.44

## 2018-02-16 NOTE — MAU Provider Note (Signed)
History     CSN: [id number]  Arrival date and time: 02/16/18 1413   First Provider Initiated Contact with Patient 02/16/18 1447      Chief Complaint  Patient presents with  . Vaginal Discharge   22 y.o. non-pregnant female here with vaginal pain. Pain started yesterday after IC. This morning she started having a yellow malodorous vaginal discharge. She doesn's think the swab earlier detected her bacterial infection. Pain is worse when she urinates. 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 She is taking Valtrex and using numbing gel but it's not helping.   Past Medical History:  Diagnosis Date  . Anemia     Past Surgical History:  Procedure Laterality Date  . WISDOM TOOTH EXTRACTION      Family History  Problem Relation Age of Onset  . Anemia Mother   . Depression Mother   . Diabetes Mother   . Anemia Maternal Grandmother     Social History   Tobacco Use  . Smoking status: Never Smoker  . Smokeless tobacco: Never Used  Substance Use Topics  . Alcohol use: No    Alcohol/week: 0.0 standard drinks  . Drug use: No    Allergies: No Known Allergies  Medications Prior to Admission  Medication Sig Dispense Refill Last Dose  . valACYclovir (VALTREX) 1000 MG tablet Take 1 tablet (1,000 mg total) by mouth 2 (two) times daily for 10 doses. 20 tablet 0 02/16/2018 at Unknown time  . docusate sodium (COLACE) 100 MG capsule Take 1 capsule (100 mg total) by mouth 2 (two) times daily. 60 capsule 0   . erythromycin ophthalmic ointment Place a 1/2 inch ribbon of ointment into the lower eyelid. (Patient not taking: Reported on 06/28/2016) 1 g 1 Not Taking  . nystatin cream (MYCOSTATIN) Apply to affected area 2 times daily 15 g 0   . Prenatal Vit-Fe Fumarate-FA (PRENATAL MULTIVITAMIN) TABS tablet Take 1 tablet by mouth daily at 12 noon.   Taking  . triamcinolone cream (KENALOG) 0.1 % Apply 1 application topically 2 (two) times daily. 15 g 0     Review of  Systems  Gastrointestinal: Negative.   Genitourinary: Positive for vaginal discharge and vaginal pain.   Physical Exam   Blood pressure 117/80, pulse 84, temperature 98.4 F (36.9 C), temperature source Oral, resp. rate 16, weight 75.3 kg, last menstrual period 02/05/2018, SpO2 99 %, currently breastfeeding.  Physical Exam  Constitutional: She is oriented to person, place, and time. She appears well-developed and well-nourished. No distress.  HENT:  Head: Normocephalic and atraumatic.  Neck: Normal range of motion.  Cardiovascular: Normal rate.  Respiratory: Effort normal. No respiratory distress.  Genitourinary:     Musculoskeletal: Normal range of motion.  Neurological: She is alert and oriented to person, place, and time.  Skin: Skin is warm and dry.  Psychiatric: She has a normal mood and affect.   Results for orders placed or performed during the hospital encounter of 02/16/18 (from the past 24 hour(s))  Wet prep, genital     Status: Abnormal   Collection Time: 02/16/18  2:58 PM  Result Value Ref Range   Yeast Wet Prep HPF POC NONE SEEN NONE SEEN   Trich, Wet Prep NONE SEEN NONE SEEN   Clue Cells Wet Prep HPF POC NONE SEEN NONE SEEN   WBC, Wet Prep HPF POC MANY (A) NONE SEEN   Sperm NONE SEEN    MAU Course  Procedures  MDM Labs  ordered and reviewed. No evidence of BV. Suspect HSV infection. GC and HSV culture pending. Stable for discharge home.  Assessment and Plan   1. HSV infection    Discharge home Continue Valtrex Lidoocaine gel prn Cool packs to perineum Follow up at [facility] prn Return to MAU for OBGYN emergencies  Allergies as of 02/16/2018   No Known Allergies     Medication List    STOP taking these medications   erythromycin ophthalmic ointment   nystatin cream Commonly known as:  MYCOSTATIN   triamcinolone cream 0.1 % Commonly known as:  KENALOG     TAKE these medications   docusate sodium 100 MG capsule Commonly known as:  COLACE Take  1 capsule (100 mg total) by mouth 2 (two) times daily.   prenatal multivitamin Tabs tablet Take 1 tablet by mouth daily at 12 noon.   valACYclovir 1000 MG tablet Commonly known as:  VALTREX Take 1 tablet (1,000 mg total) by mouth 2 (two) times daily for 10 doses.      Donette Larry, CNM 02/16/2018, 3:25 PM

## 2018-02-16 NOTE — Discharge Instructions (Signed)

## 2018-02-16 NOTE — MAU Note (Signed)
Doesn't think she was swabbed well enough last night.  Has a very fishy odor, has a d/c. Has some inflammation.. Is in a lot of pain.

## 2018-02-16 NOTE — MAU Provider Note (Addendum)
History     CSN: [id number]  Arrival date and time: 02/15/18 2203     Chief Complaint  Patient presents with  . Vaginal Bleeding  . Vaginal Discharge  . Vaginal Pain   Sally Shannon is a 22 yo G1P1 presenting with a 1 day history of vaginal discharge and pain. She states after having rough intercourse last night with her husband, she started to experience severe vaginal tenderness and white-yellow discharge with minimal bleeding. Has not tried anything at home for relief. States it feels like a bacterial infection that she has in the past. Has not looked at the area. Denies any dysuria, back pain, fever, or chills.     Past Medical History:  Diagnosis Date  . Anemia     Past Surgical History:  Procedure Laterality Date  . WISDOM TOOTH EXTRACTION      Family History  Problem Relation Age of Onset  . Anemia Mother   . Depression Mother   . Diabetes Mother   . Anemia Maternal Grandmother     Social History   Tobacco Use  . Smoking status: Never Smoker  . Smokeless tobacco: Never Used  Substance Use Topics  . Alcohol use: No    Alcohol/week: 0.0 standard drinks  . Drug use: No    Allergies: No Known Allergies  Medications Prior to Admission  Medication Sig Dispense Refill Last Dose  . docusate sodium (COLACE) 100 MG capsule Take 1 capsule (100 mg total) by mouth 2 (two) times daily. 60 capsule 0   . erythromycin ophthalmic ointment Place a 1/2 inch ribbon of ointment into the lower eyelid. (Patient not taking: Reported on 06/28/2016) 1 g 1 Not Taking  . fluconazole (DIFLUCAN) 150 MG tablet Take 1 tablet (150 mg total) by mouth daily. 1 tablet 0   . nystatin cream (MYCOSTATIN) Apply to affected area 2 times daily 15 g 0   . Prenatal Vit-Fe Fumarate-FA (PRENATAL MULTIVITAMIN) TABS tablet Take 1 tablet by mouth daily at 12 noon.   Taking  . triamcinolone cream (KENALOG) 0.1 % Apply 1 application topically 2 (two) times daily. 15 g 0     Review of Systems   Constitutional: Negative for chills and fever.  Gastrointestinal: Negative for abdominal pain.  Genitourinary: Positive for vaginal discharge and vaginal pain. Negative for difficulty urinating, dysuria, flank pain, frequency, hematuria, pelvic pain and urgency.  Musculoskeletal: Negative for back pain.   Physical Exam   Blood pressure 113/68, pulse 81, temperature 98.2 F (36.8 C), resp. rate 19, height 5\' 4"  (1.626 m), weight 75.4 kg, last menstrual period 02/05/2018, SpO2 100 %, currently breastfeeding.  Physical Exam  Constitutional: She is oriented to person, place, and time. She appears well-developed and well-nourished. No distress.  Eyes: Pupils are equal, round, and reactive to light.  Respiratory: Effort normal.  Genitourinary:  Genitourinary Comments: Approximately 1 cm labial superficial abrasion with irregular borders at the inferior midline opening to vaginal vault. Slightly erythematous. Exquistively tender to palpation. No active bleeding. Minimal slightly white vaginal discharge noted.    Neurological: She is alert and oriented to person, place, and time.  Skin: Skin is warm and dry.  Psychiatric: She has a normal mood and affect.     Assessment and Plan  1. Herpes simplex outbreak vs. Labial abrasion: Concern for herpes infection given raw appearance to labial region and tenderness out of proportion expected with a superficial abrasion. However, laceration also possible given history of rough intercourse. Discussed possible routes of transmission  and course of HSV with patient. Wet prep obtained and negative for trich, bacterial, or yeast infection. Considered Gc/Chlam, obtained and pending, however less likely given clinical appearance and symptomatology. Unlikely UTI, given no report of dysuria, urinary frequency/urgency, or abdominal pain. Urinalysis with only large leukocytes. BHcg negative.  -Obtained HSV swab, patient to be notified with results when return   -Lidocaine ointment for pain QID PRN  -Prescribed Valtrex 1g BID x 10 days (given first dose valtrex 1000mg  tonight)   -F/u with PCP if not improving or worsening symptoms   Allayne Stack  Family Medicine PGY-1 02/16/2018, 12:34 AM

## 2018-02-19 LAB — GC/CHLAMYDIA PROBE AMP (~~LOC~~) NOT AT ARMC
CHLAMYDIA, DNA PROBE: NEGATIVE
Neisseria Gonorrhea: NEGATIVE

## 2018-02-20 LAB — HSV CULTURE AND TYPING

## 2018-02-20 IMAGING — US US OB COMP LESS 14 WK
1 series · 15 of 28 positions shown · non-contrast
Comparison: October 01, 2015

CLINICAL DATA: Abdominal pain

EXAM:
OBSTETRIC <14 WK ULTRASOUND
TECHNIQUE: Transabdominal ultrasound was performed for evaluation of the
gestation as well as the maternal uterus and adnexal regions.

[Series 1: us ob comp less 14 wk · 15 of 45 slices shown]
[im 1/45]
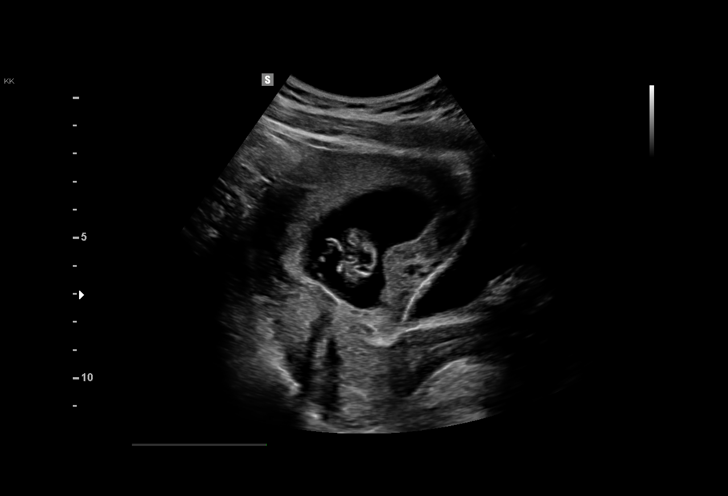
[im 4/45]
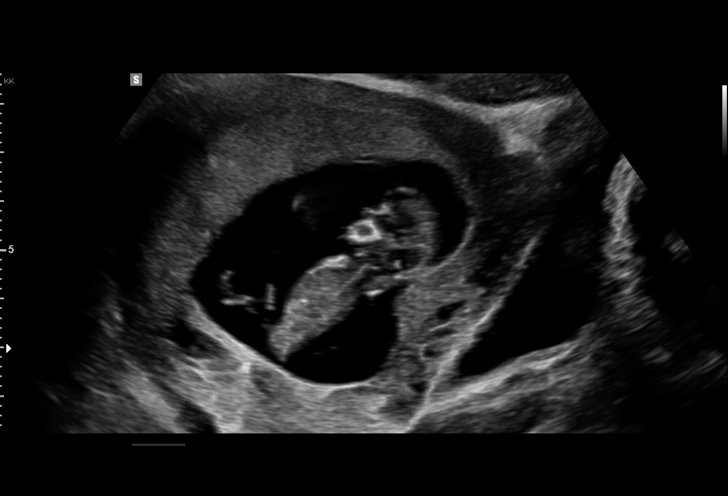
[im 7/45]
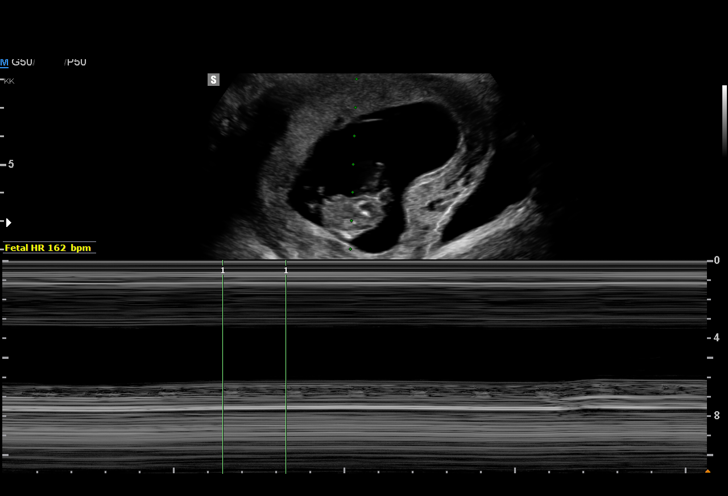
[im 10/45]
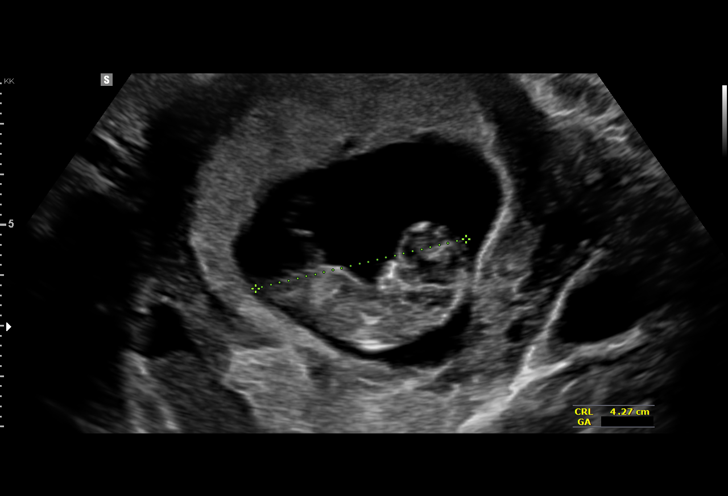
[im 14/45]
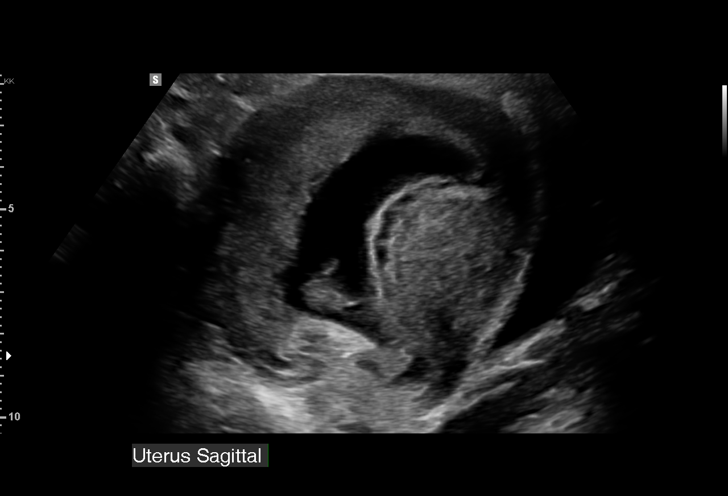
[im 17/45]
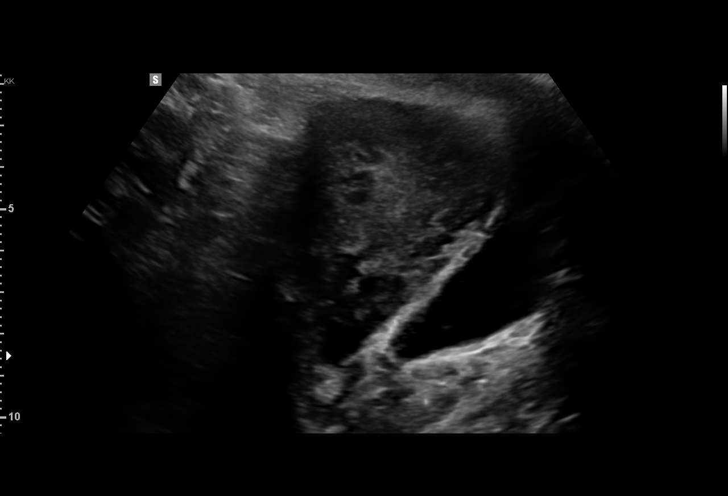
[im 20/45]
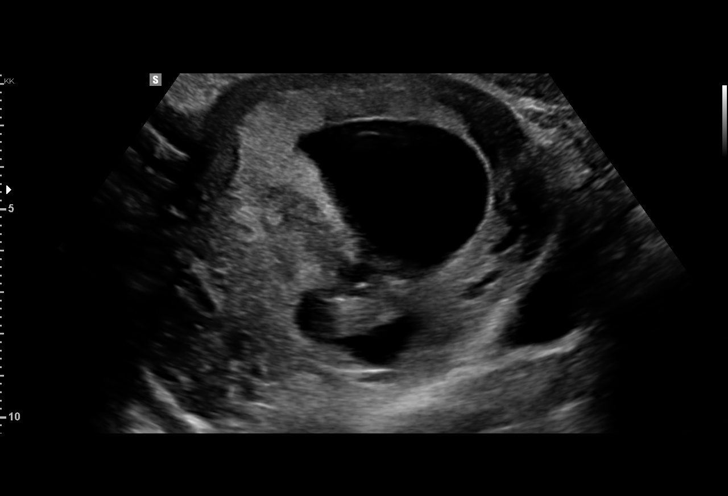
[im 23/45]
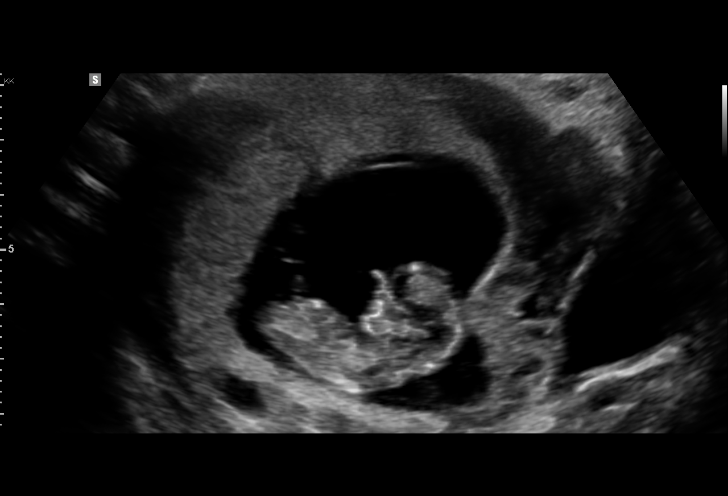
[im 25/45]
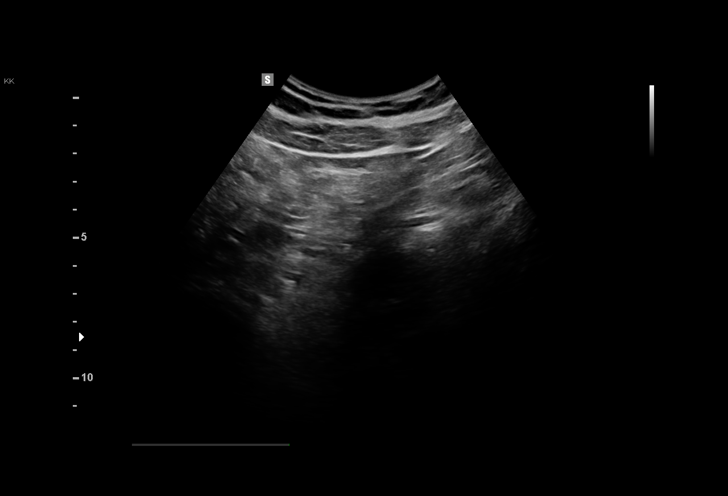
[im 28/45]
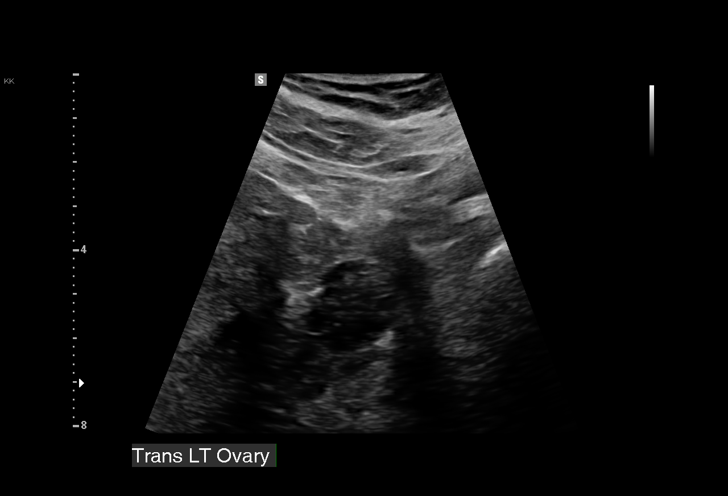
[im 31/45]
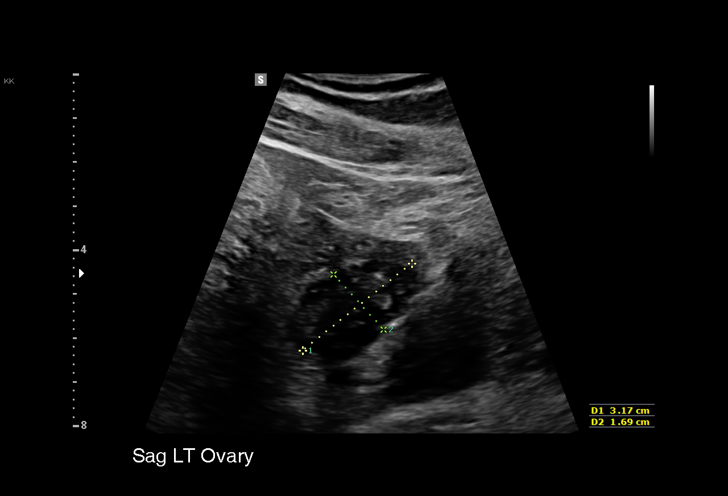
[im 35/45]
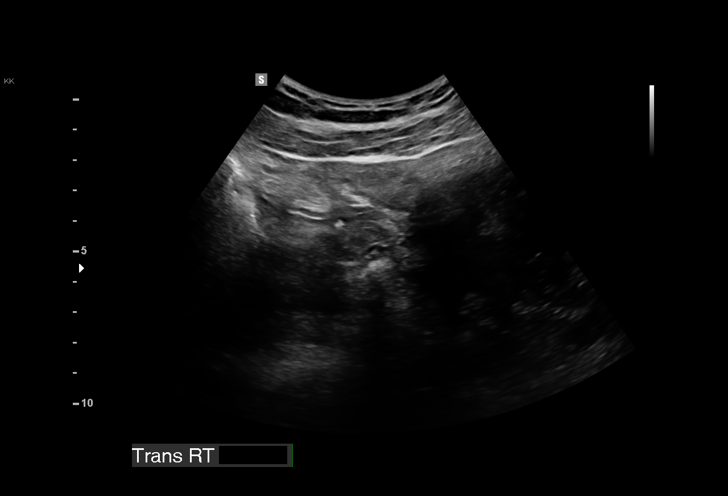
[im 38/45]
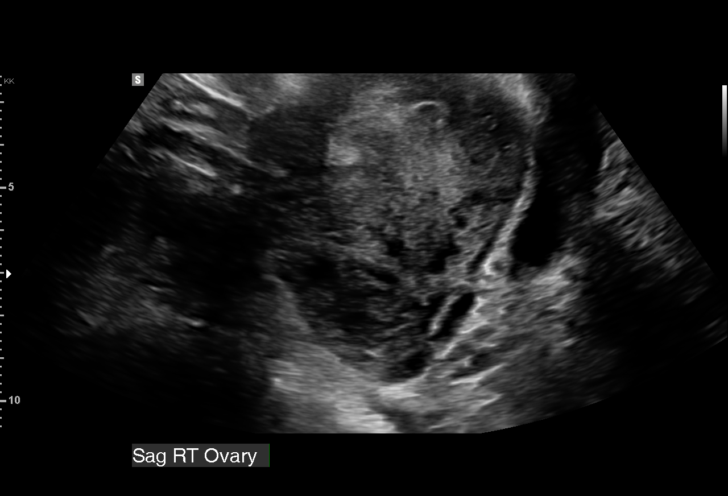
[im 41/45]
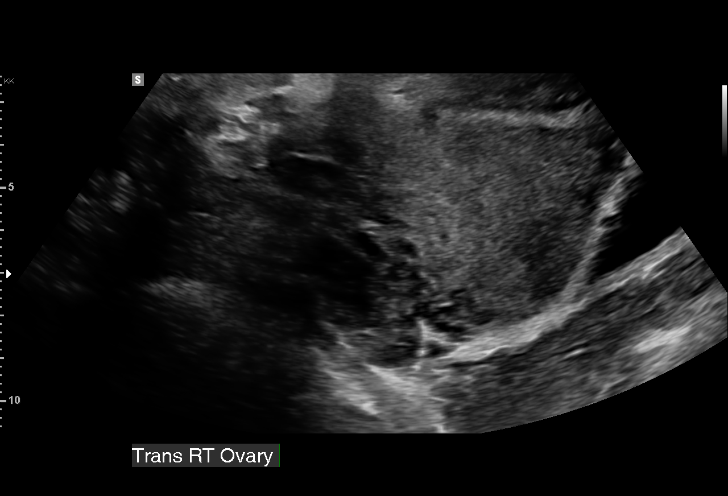
[im 45/45]
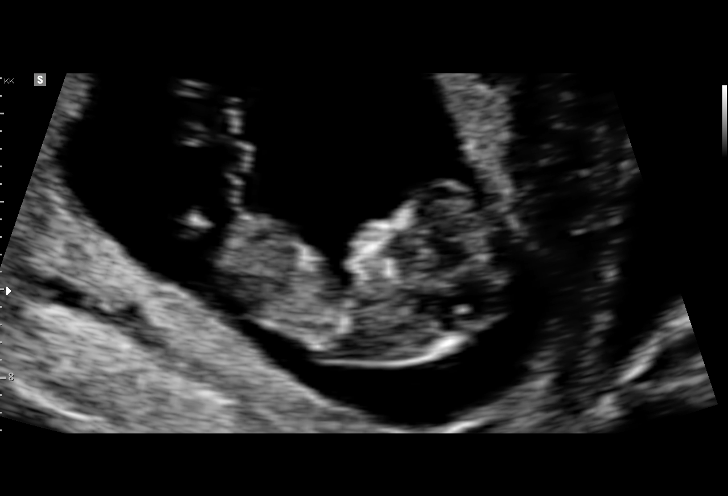

[15 of 28 positions shown; findings below may reference images not displayed]

FINDINGS: Intrauterine gestational sac: Visualized

Yolk sac:  Visualized

Embryo:  Visualized

Cardiac Activity: Visualized

Heart Rate: 162 bpm

CRL:   39  mm   10 w 6 d                  US EDC: May 24, 2016

Subchorionic hemorrhage:  None visualized.

Maternal uterus/adnexae: Cervical os is closed. Maternal ovaries
appear normal in size and configuration. There is no extrauterine
pelvic or adnexal mass. There is no free pelvic fluid.
IMPRESSION: Single live intrauterine gestation with estimated gestational age of
approximately 11 weeks based on crown-rump length and prior
ultrasound. No subchorionic hemorrhage. No extrauterine lesions
seen. Cervical os closed.

## 2018-03-09 ENCOUNTER — Ambulatory Visit (INDEPENDENT_AMBULATORY_CARE_PROVIDER_SITE_OTHER): Payer: Medicaid Other | Admitting: Student

## 2018-03-09 ENCOUNTER — Encounter: Payer: Self-pay | Admitting: Student

## 2018-03-09 VITALS — BP 131/72 | HR 89 | Wt 163.0 lb

## 2018-03-09 DIAGNOSIS — Z30431 Encounter for routine checking of intrauterine contraceptive device: Secondary | ICD-10-CM | POA: Diagnosis not present

## 2018-03-09 NOTE — Patient Instructions (Signed)
Contraception Choices Contraception, also called birth control, refers to methods or devices that prevent pregnancy. Hormonal methods Contraceptive implant A contraceptive implant is a thin, plastic tube that contains a hormone. It is inserted into the upper part of the arm. It can remain in place for up to 3 years. Progestin-only injections Progestin-only injections are injections of progestin, a synthetic form of the hormone progesterone. They are given every 3 months by a health care provider. Birth control pills Birth control pills are pills that contain hormones that prevent pregnancy. They must be taken once a day, preferably at the same time each day. Birth control patch The birth control patch contains hormones that prevent pregnancy. It is placed on the skin and must be changed once a week for three weeks and removed on the fourth week. A prescription is needed to use this method of contraception. Vaginal ring A vaginal ring contains hormones that prevent pregnancy. It is placed in the vagina for three weeks and removed on the fourth week. After that, the process is repeated with a new ring. A prescription is needed to use this method of contraception. Emergency contraceptive Emergency contraceptives prevent pregnancy after unprotected sex. They come in pill form and can be taken up to 5 days after sex. They work best the sooner they are taken after having sex. Most emergency contraceptives are available without a prescription. This method should not be used as your only form of birth control. Barrier methods Female condom A female condom is a thin sheath that is worn over the penis during sex. Condoms keep sperm from going inside a woman's body. They can be used with a spermicide to increase their effectiveness. They should be disposed after a single use. Female condom A female condom is a soft, loose-fitting sheath that is put into the vagina before sex. The condom keeps sperm from going  inside a woman's body. They should be disposed after a single use. Diaphragm A diaphragm is a soft, dome-shaped barrier. It is inserted into the vagina before sex, along with a spermicide. The diaphragm blocks sperm from entering the uterus, and the spermicide kills sperm. A diaphragm should be left in the vagina for 6-8 hours after sex and removed within 24 hours. A diaphragm is prescribed and fitted by a health care provider. A diaphragm should be replaced every 1-2 years, after giving birth, after gaining more than 15 lb (6.8 kg), and after pelvic surgery. Cervical cap A cervical cap is a round, soft latex or plastic cup that fits over the cervix. It is inserted into the vagina before sex, along with spermicide. It blocks sperm from entering the uterus. The cap should be left in place for 6-8 hours after sex and removed within 48 hours. A cervical cap must be prescribed and fitted by a health care provider. It should be replaced every 2 years. Sponge A sponge is a soft, circular piece of polyurethane foam with spermicide on it. The sponge helps block sperm from entering the uterus, and the spermicide kills sperm. To use it, you make it wet and then insert it into the vagina. It should be inserted before sex, left in for at least 6 hours after sex, and removed and thrown away within 30 hours. Spermicides Spermicides are chemicals that kill or block sperm from entering the cervix and uterus. They can come as a cream, jelly, suppository, foam, or tablet. A spermicide should be inserted into the vagina with an applicator at least 10-15 minutes before   sex to allow time for it to work. The process must be repeated every time you have sex. Spermicides do not require a prescription. Intrauterine contraception Intrauterine device (IUD) An IUD is a T-shaped device that is put in a woman's uterus. There are two types:  Hormone IUD.This type contains progestin, a synthetic form of the hormone progesterone. This  type can stay in place for 3-5 years.  Copper IUD.This type is wrapped in copper wire. It can stay in place for 10 years.  Permanent methods of contraception Female tubal ligation In this method, a woman's fallopian tubes are sealed, tied, or blocked during surgery to prevent eggs from traveling to the uterus. Hysteroscopic sterilization In this method, a small, flexible insert is placed into each fallopian tube. The inserts cause scar tissue to form in the fallopian tubes and block them, so sperm cannot reach an egg. The procedure takes about 3 months to be effective. Another form of birth control must be used during those 3 months. Female sterilization This is a procedure to tie off the tubes that carry sperm (vasectomy). After the procedure, the man can still ejaculate fluid (semen). Natural planning methods Natural family planning In this method, a couple does not have sex on days when the woman could become pregnant. Calendar method This means keeping track of the length of each menstrual cycle, identifying the days when pregnancy can happen, and not having sex on those days. Ovulation method In this method, a couple avoids sex during ovulation. Symptothermal method This method involves not having sex during ovulation. The woman typically checks for ovulation by watching changes in her temperature and in the consistency of cervical mucus. Post-ovulation method In this method, a couple waits to have sex until after ovulation. Summary  Contraception, also called birth control, means methods or devices that prevent pregnancy.  Hormonal methods of contraception include implants, injections, pills, patches, vaginal rings, and emergency contraceptives.  Barrier methods of contraception can include female condoms, female condoms, diaphragms, cervical caps, sponges, and spermicides.  There are two types of IUDs (intrauterine devices). An IUD can be put in a woman's uterus to prevent pregnancy  for 3-5 years.  Permanent sterilization can be done through a procedure for males, females, or both.  Natural family planning methods involve not having sex on days when the woman could become pregnant. This information is not intended to replace advice given to you by your health care provider. Make sure you discuss any questions you have with your health care provider. Document Released: 04/18/2005 Document Revised: 05/21/2016 Document Reviewed: 05/21/2016 Elsevier Interactive Patient Education  2018 [employer]  

## 2018-03-09 NOTE — Progress Notes (Signed)
History:  Ms. Sally Shannon is a 22 y.o. G1P1001 who presents to clinic today for IUD concerns. Has had paragard IUD in place since her PP visit last year. Has had no issues with it until last month. She was seen in [facility] for vaginal irritation & ?HSV lesion (culture came back negative). Was concerned that her IUD caused these symptoms & may lead to her having PID (otherwise does not have increased risk of PID). Also concerned that since the paragard is used for up to 10 years, that she would have issues with fertility. Considering patch for birth control but admits that she wants a safe option.   Does not smoke. Denies hx of hypertension, VTE, cancer, or migraines.    Patient Active Problem List   Diagnosis Date Noted  . Right knee pain 06/21/2017  . Pregnancy 05/19/2016  . NSVD (normal spontaneous vaginal delivery) 05/19/2016  . GDM (gestational diabetes mellitus), class A1 04/27/2016  . Hemorrhoids during pregnancy 04/11/2016  . Gestational diabetes mellitus (GDM) affecting pregnancy, antepartum 03/11/2016  . Yeast vaginitis 02/10/2016  . Bacteria in urine 01/13/2016  . Supervision of high risk pregnancy in third trimester 11/17/2015  . Pain in joint, ankle and foot 10/06/2013  . Nevus, non-neoplastic 10/06/2013    No Known Allergies  Current Outpatient Medications on File Prior to Visit  Medication Sig Dispense Refill  . docusate sodium (COLACE) 100 MG capsule Take 1 capsule (100 mg total) by mouth 2 (two) times daily. (Patient not taking: Reported on 03/09/2018) 60 capsule 0  . Prenatal Vit-Fe Fumarate-FA (PRENATAL MULTIVITAMIN) TABS tablet Take 1 tablet by mouth daily at 12 noon.     No current facility-administered medications on file prior to visit.      The following portions of the patient's history were reviewed and updated as appropriate: allergies, current medications, family history, past medical history, social history, past surgical history and problem list.  Review of  Systems:  Other than those mentioned in HPI all ROS negative   Objective:  Physical Exam BP 131/72   Pulse 89   Wt 163 lb (73.9 kg)   BMI 27.98 kg/m  CONSTITUTIONAL: Well-developed, well-nourished female in no acute distress.  EYES: EOM intact, conjunctivae normal, no scleral icterus HEAD: Normocephalic, atraumatic CARDIOVASCULAR: Normal heart rate noted, regular rhythm. No cyanosis or edema. 2+ distal pulses.  RESPIRATORY: Effort  normal, no problems with respiration noted. GENITOURINARY: Normal appearing external genitalia; normal appearing vaginal mucosa and cervix.  Normal appearing discharge.  IUD strings visualized. No CMT. NO abnormal cervical discharge. Small amount of dark red mucoid blood.  PSYCHIATRIC: Normal mood and affect. Normal behavior. Normal judgment and thought content.  Assessment & Plan:  1. Encounter for routine checking of intrauterine contraceptive device (IUD) -Reviewed all forms of reversible birth control options available including abstinence; over the counter/barrier methods; hormonal contraceptive medication including pill, patch, ring, injection,contraceptive implant; hormonal and nonhormonal IUDs. Questions were answered.  Information was given to patient to review.  -Discussed increased risk of VTE w/ortho evra patch vs low risk w/paragard since it's non hormonal. At this time patient is reassured & would like to keep IUD in place -Given contraception information to review. Patient aware that she can return at anytime if she changes her mind & wants different contraception method.     Judeth Horn, NP 03/09/2018 11:41 AM

## 2018-06-14 DIAGNOSIS — Z131 Encounter for screening for diabetes mellitus: Secondary | ICD-10-CM | POA: Diagnosis not present

## 2018-06-14 DIAGNOSIS — Z13220 Encounter for screening for lipoid disorders: Secondary | ICD-10-CM | POA: Diagnosis not present

## 2018-06-14 DIAGNOSIS — M545 Low back pain: Secondary | ICD-10-CM | POA: Diagnosis not present

## 2018-06-20 ENCOUNTER — Ambulatory Visit: Payer: Medicaid Other | Admitting: Certified Nurse Midwife

## 2018-08-30 IMAGING — US US MFM OB FOLLOW-UP
1 series · 13 of 28 positions shown · non-contrast
Comparison: none

[Series 1: us mfm ob follow-up · 44 acquisitions, 13 frames shown]
[im 2/44]
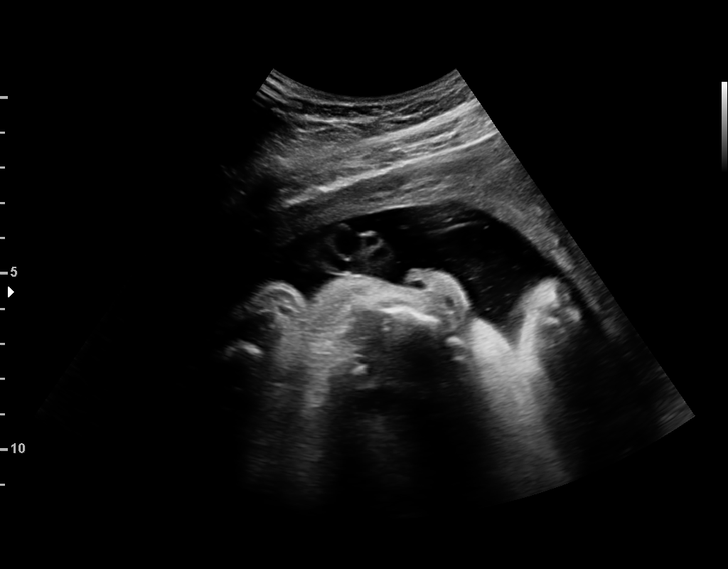
[im 5/44]
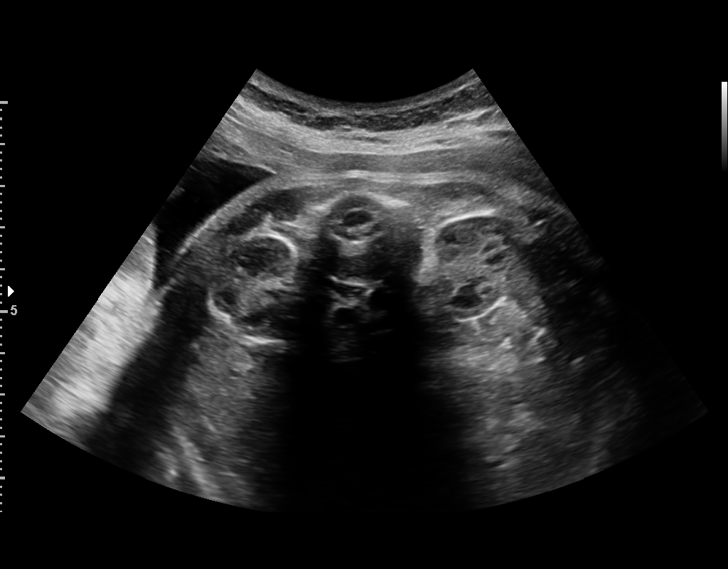
[im 8/44]
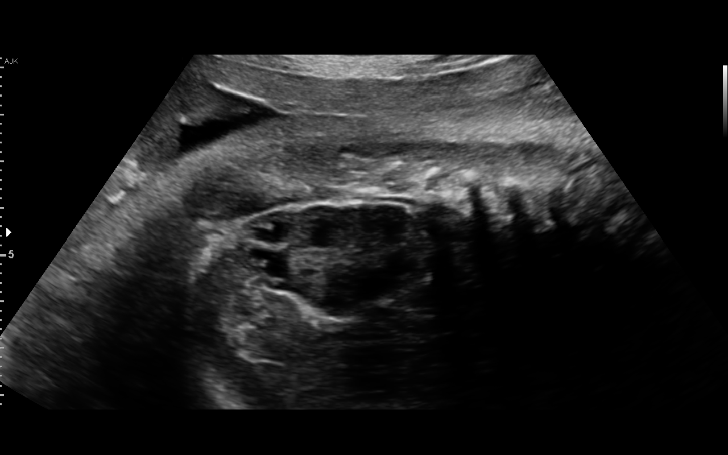
[im 12/44]
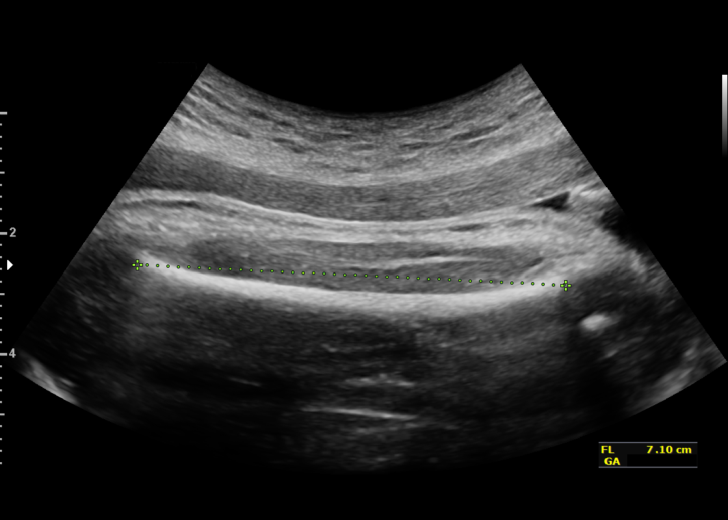
[im 15/44]
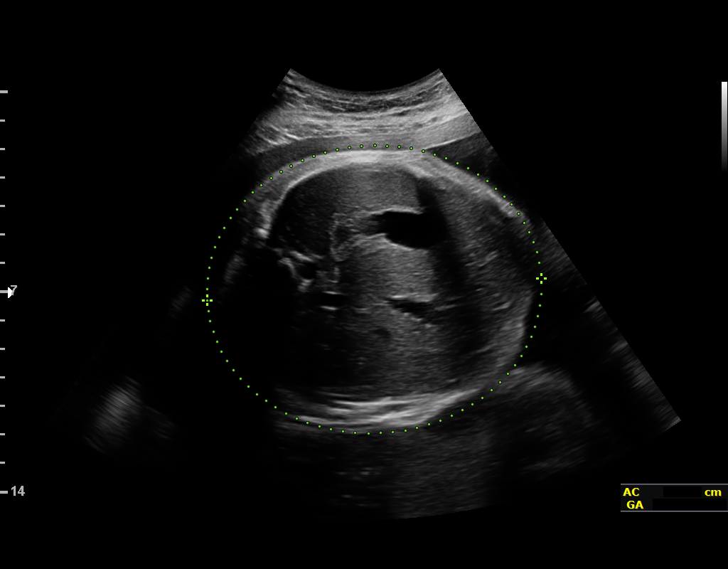
[im 18/44]
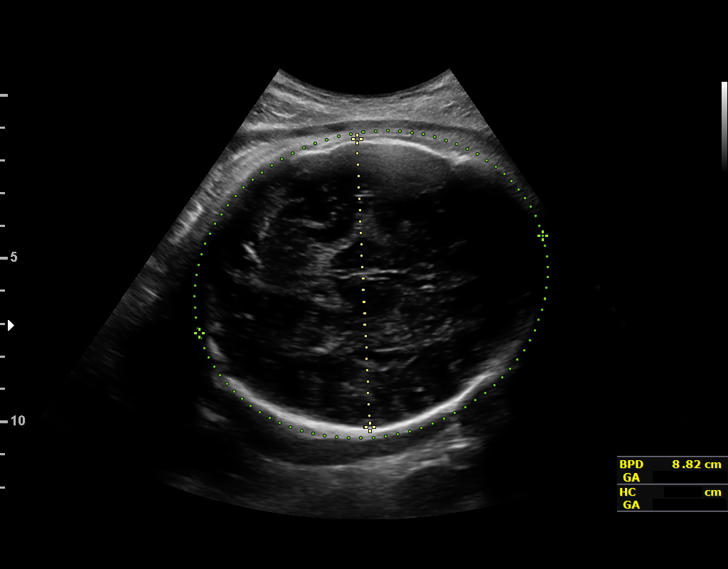
[im 23/44]
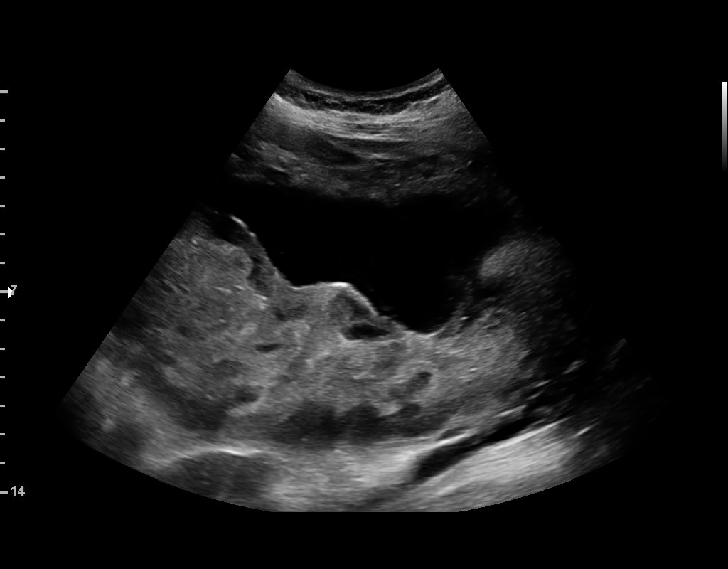
[im 26/44]
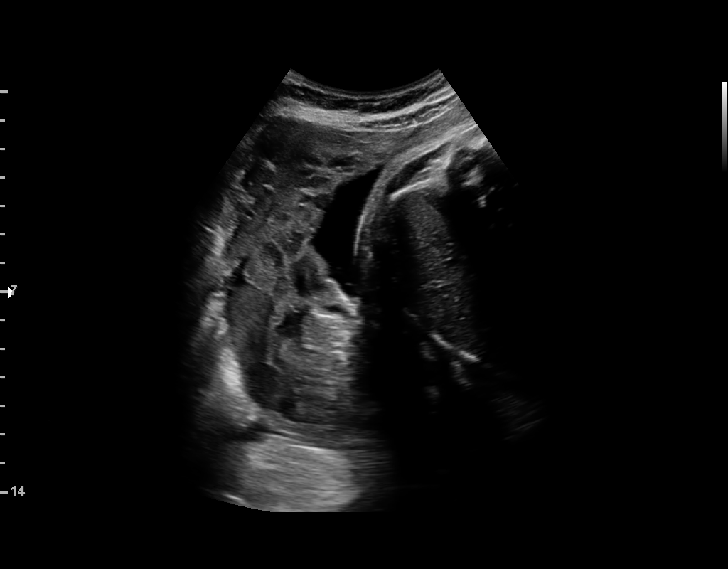
[im 29/44]
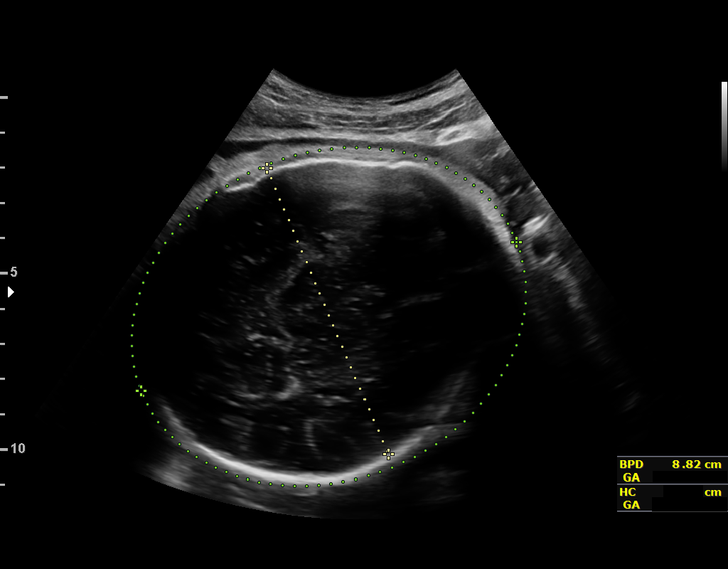
[im 32/44]
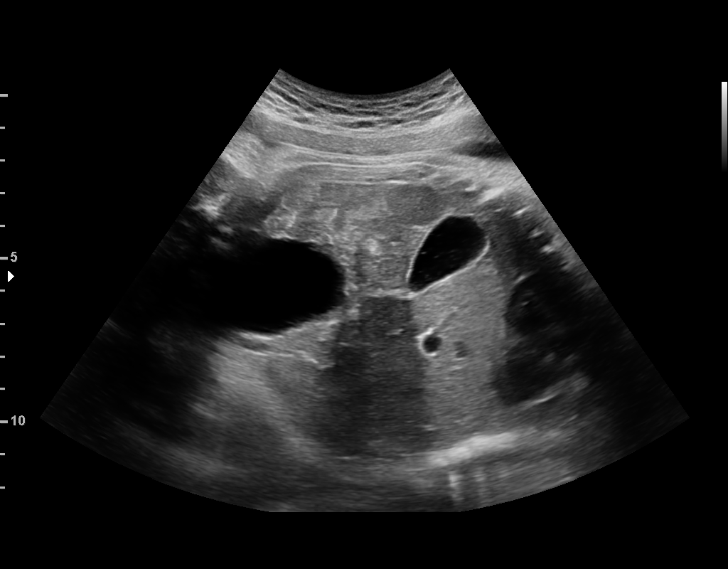
[im 36/44]
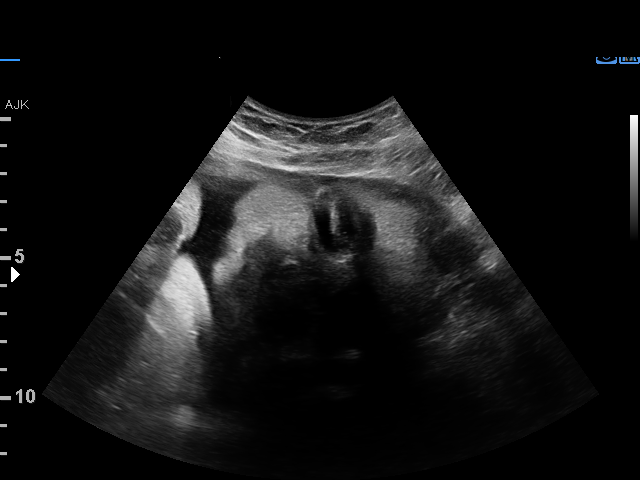
[im 39/44]
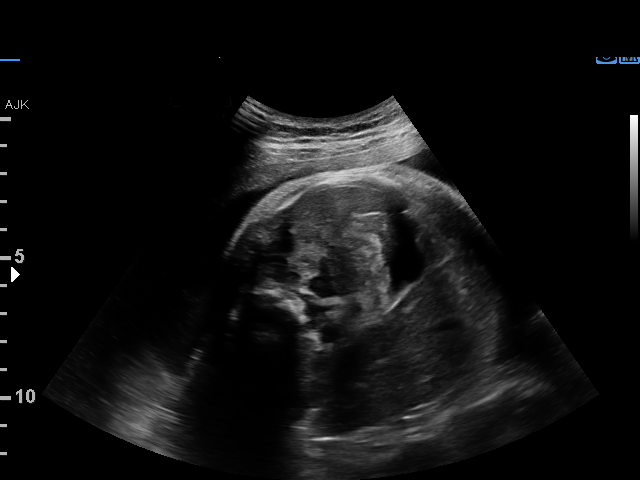
[im 42/44]
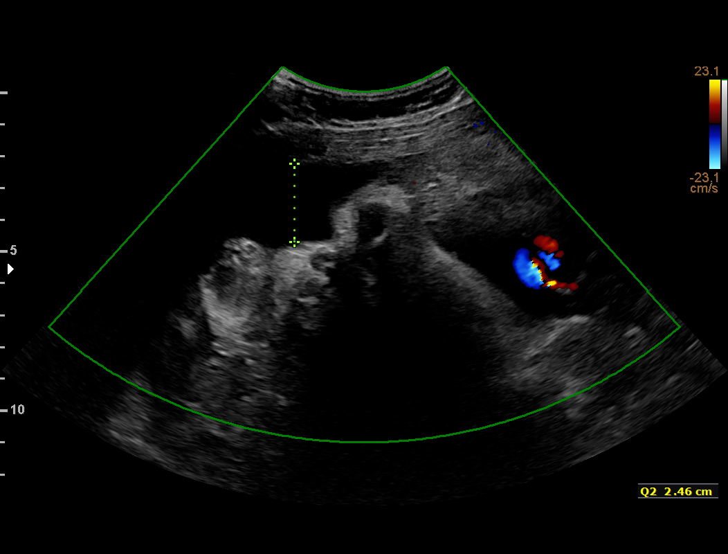

[13 of 28 positions shown; findings below may reference images not displayed]

OB/Gyn Clinic

1  MITSUGI DANUSHKA            [id number]      [id number]     [id number]
Indications

38 weeks gestation of pregnancy
Encounter for other antenatal screening
follow-up
Gestational diabetes in pregnancy, diet
controlled
OB History

Blood Type:            Height:  5'4"   Weight (lb):  184      BMI:
Gravidity:    1         Term:   0        Prem:   0        SAB:   0
TOP:          0       Ectopic:  0        Living: 0
Fetal Evaluation

Num Of Fetuses:     1
Fetal Heart         157
Rate(bpm):
Cardiac Activity:   Observed
Presentation:       Cephalic
Placenta:           Posterior, above cervical os
P. Cord Insertion:  Previously Visualized

Amniotic Fluid
AFI FV:      Subjectively within normal limits

AFI Sum(cm)     %Tile       Largest Pocket(cm)
16.55           64
RUQ(cm)       RLQ(cm)       LUQ(cm)        LLQ(cm)
4.38
Biometry

BPD:      88.1  mm     G. Age:  35w 4d         13  %    CI:        73.82   %   70 - 86
FL/HC:      21.7   %   20.9 -
HC:      325.7  mm     G. Age:  37w 0d         11  %    HC/AC:      0.95       0.92 -
AC:       344   mm     G. Age:  38w 2d         76  %    FL/BPD:     80.4   %   71 - 87
FL:       70.8  mm     G. Age:  36w 2d         15  %    FL/AC:      20.6   %   20 - 24
Est. FW:    2151  gm          7 lb      63  %
Gestational Age

LMP:           38w 0d       Date:   08/19/15                 EDD:   05/25/16
U/S Today:     36w 6d                                        EDD:   06/02/16
Best:          38w 0d    Det. By:   LMP  (08/19/15)          EDD:   05/25/16
Anatomy

Cranium:               Appears normal         Aortic Arch:            Previously seen
Cavum:                 Previously seen        Ductal Arch:            Previously seen
Ventricles:            Appears normal         Diaphragm:              Appears normal
Choroid Plexus:        Previously seen        Stomach:                Appears normal, left
sided
Cerebellum:            Previously seen        Abdomen:                Appears normal
Posterior Fossa:       Previously seen        Abdominal Wall:         Previously seen
Nuchal Fold:           Previously seen        Cord Vessels:           Previously seen
Face:                  Orbits and profile     Kidneys:                Appear normal
previously seen
Lips:                  Appears normal         Bladder:                Appears normal
Thoracic:              Appears normal         Spine:                  Previously seen
Heart:                 Previously seen        Upper Extremities:      Previously seen
RVOT:                  Previously seen        Lower Extremities:      Previously seen
LVOT:                  Previously seen

Other:  Fetus appears to be a female. Heels previously seen.
Cervix Uterus Adnexa

Cervix
Not visualized (advanced GA >45wks)

Uterus
No abnormality visualized.

Left Ovary
Not visualized.

Right Ovary
Not visualized.

Adnexa:       No abnormality visualized.
Impression

SIUP at 38+0 weeks
Cephalic presentation
Normal interval anatomy; anatomic survey complete
Normal amniotic fluid volume
Appropriate interval growth with EFW at the 63rd %tile
Recommendations

Follow-up as clinically indicated

## 2019-03-11 DIAGNOSIS — S39012A Strain of muscle, fascia and tendon of lower back, initial encounter: Secondary | ICD-10-CM | POA: Diagnosis not present

## 2019-03-11 DIAGNOSIS — M9905 Segmental and somatic dysfunction of pelvic region: Secondary | ICD-10-CM | POA: Diagnosis not present

## 2019-03-11 DIAGNOSIS — M9901 Segmental and somatic dysfunction of cervical region: Secondary | ICD-10-CM | POA: Diagnosis not present

## 2019-03-11 DIAGNOSIS — R519 Headache, unspecified: Secondary | ICD-10-CM | POA: Diagnosis not present

## 2019-03-11 DIAGNOSIS — M9903 Segmental and somatic dysfunction of lumbar region: Secondary | ICD-10-CM | POA: Diagnosis not present

## 2019-03-11 DIAGNOSIS — M5386 Other specified dorsopathies, lumbar region: Secondary | ICD-10-CM | POA: Diagnosis not present

## 2019-03-15 DIAGNOSIS — M9905 Segmental and somatic dysfunction of pelvic region: Secondary | ICD-10-CM | POA: Diagnosis not present

## 2019-03-15 DIAGNOSIS — M9901 Segmental and somatic dysfunction of cervical region: Secondary | ICD-10-CM | POA: Diagnosis not present

## 2019-03-15 DIAGNOSIS — R519 Headache, unspecified: Secondary | ICD-10-CM | POA: Diagnosis not present

## 2019-03-15 DIAGNOSIS — M9903 Segmental and somatic dysfunction of lumbar region: Secondary | ICD-10-CM | POA: Diagnosis not present

## 2019-03-15 DIAGNOSIS — M5386 Other specified dorsopathies, lumbar region: Secondary | ICD-10-CM | POA: Diagnosis not present

## 2019-03-15 DIAGNOSIS — S39012A Strain of muscle, fascia and tendon of lower back, initial encounter: Secondary | ICD-10-CM | POA: Diagnosis not present

## 2019-03-18 DIAGNOSIS — S39012A Strain of muscle, fascia and tendon of lower back, initial encounter: Secondary | ICD-10-CM | POA: Diagnosis not present

## 2019-03-18 DIAGNOSIS — M9901 Segmental and somatic dysfunction of cervical region: Secondary | ICD-10-CM | POA: Diagnosis not present

## 2019-03-18 DIAGNOSIS — M5386 Other specified dorsopathies, lumbar region: Secondary | ICD-10-CM | POA: Diagnosis not present

## 2019-03-18 DIAGNOSIS — M9903 Segmental and somatic dysfunction of lumbar region: Secondary | ICD-10-CM | POA: Diagnosis not present

## 2019-03-18 DIAGNOSIS — M9905 Segmental and somatic dysfunction of pelvic region: Secondary | ICD-10-CM | POA: Diagnosis not present

## 2019-03-18 DIAGNOSIS — R519 Headache, unspecified: Secondary | ICD-10-CM | POA: Diagnosis not present

## 2019-03-20 DIAGNOSIS — M9905 Segmental and somatic dysfunction of pelvic region: Secondary | ICD-10-CM | POA: Diagnosis not present

## 2019-03-20 DIAGNOSIS — R519 Headache, unspecified: Secondary | ICD-10-CM | POA: Diagnosis not present

## 2019-03-20 DIAGNOSIS — M5386 Other specified dorsopathies, lumbar region: Secondary | ICD-10-CM | POA: Diagnosis not present

## 2019-03-20 DIAGNOSIS — M9901 Segmental and somatic dysfunction of cervical region: Secondary | ICD-10-CM | POA: Diagnosis not present

## 2019-03-20 DIAGNOSIS — M9903 Segmental and somatic dysfunction of lumbar region: Secondary | ICD-10-CM | POA: Diagnosis not present

## 2019-03-20 DIAGNOSIS — S39012A Strain of muscle, fascia and tendon of lower back, initial encounter: Secondary | ICD-10-CM | POA: Diagnosis not present

## 2019-03-25 DIAGNOSIS — M5386 Other specified dorsopathies, lumbar region: Secondary | ICD-10-CM | POA: Diagnosis not present

## 2019-03-25 DIAGNOSIS — M9905 Segmental and somatic dysfunction of pelvic region: Secondary | ICD-10-CM | POA: Diagnosis not present

## 2019-03-25 DIAGNOSIS — R519 Headache, unspecified: Secondary | ICD-10-CM | POA: Diagnosis not present

## 2019-03-25 DIAGNOSIS — M9903 Segmental and somatic dysfunction of lumbar region: Secondary | ICD-10-CM | POA: Diagnosis not present

## 2019-03-25 DIAGNOSIS — S39012A Strain of muscle, fascia and tendon of lower back, initial encounter: Secondary | ICD-10-CM | POA: Diagnosis not present

## 2019-03-25 DIAGNOSIS — M9901 Segmental and somatic dysfunction of cervical region: Secondary | ICD-10-CM | POA: Diagnosis not present

## 2019-03-27 DIAGNOSIS — M9901 Segmental and somatic dysfunction of cervical region: Secondary | ICD-10-CM | POA: Diagnosis not present

## 2019-03-27 DIAGNOSIS — M9903 Segmental and somatic dysfunction of lumbar region: Secondary | ICD-10-CM | POA: Diagnosis not present

## 2019-03-27 DIAGNOSIS — R519 Headache, unspecified: Secondary | ICD-10-CM | POA: Diagnosis not present

## 2019-03-27 DIAGNOSIS — M9905 Segmental and somatic dysfunction of pelvic region: Secondary | ICD-10-CM | POA: Diagnosis not present

## 2019-03-27 DIAGNOSIS — S39012A Strain of muscle, fascia and tendon of lower back, initial encounter: Secondary | ICD-10-CM | POA: Diagnosis not present

## 2019-03-27 DIAGNOSIS — M5386 Other specified dorsopathies, lumbar region: Secondary | ICD-10-CM | POA: Diagnosis not present

## 2019-06-10 DIAGNOSIS — M9905 Segmental and somatic dysfunction of pelvic region: Secondary | ICD-10-CM | POA: Diagnosis not present

## 2019-06-10 DIAGNOSIS — M9901 Segmental and somatic dysfunction of cervical region: Secondary | ICD-10-CM | POA: Diagnosis not present

## 2019-06-10 DIAGNOSIS — M5386 Other specified dorsopathies, lumbar region: Secondary | ICD-10-CM | POA: Diagnosis not present

## 2019-06-10 DIAGNOSIS — S39012A Strain of muscle, fascia and tendon of lower back, initial encounter: Secondary | ICD-10-CM | POA: Diagnosis not present

## 2019-06-10 DIAGNOSIS — R519 Headache, unspecified: Secondary | ICD-10-CM | POA: Diagnosis not present

## 2019-06-10 DIAGNOSIS — M9903 Segmental and somatic dysfunction of lumbar region: Secondary | ICD-10-CM | POA: Diagnosis not present

## 2019-06-17 DIAGNOSIS — N915 Oligomenorrhea, unspecified: Secondary | ICD-10-CM | POA: Diagnosis not present

## 2019-06-17 DIAGNOSIS — Z1331 Encounter for screening for depression: Secondary | ICD-10-CM | POA: Diagnosis not present

## 2019-06-17 DIAGNOSIS — E559 Vitamin D deficiency, unspecified: Secondary | ICD-10-CM | POA: Diagnosis not present

## 2019-06-17 DIAGNOSIS — Z0000 Encounter for general adult medical examination without abnormal findings: Secondary | ICD-10-CM | POA: Diagnosis not present

## 2019-06-17 DIAGNOSIS — Z1339 Encounter for screening examination for other mental health and behavioral disorders: Secondary | ICD-10-CM | POA: Diagnosis not present

## 2019-06-17 DIAGNOSIS — M542 Cervicalgia: Secondary | ICD-10-CM | POA: Diagnosis not present

## 2019-06-17 DIAGNOSIS — M545 Low back pain: Secondary | ICD-10-CM | POA: Diagnosis not present

## 2019-06-17 DIAGNOSIS — Z1159 Encounter for screening for other viral diseases: Secondary | ICD-10-CM | POA: Diagnosis not present

## 2019-06-17 DIAGNOSIS — R5383 Other fatigue: Secondary | ICD-10-CM | POA: Diagnosis not present

## 2019-06-17 DIAGNOSIS — D539 Nutritional anemia, unspecified: Secondary | ICD-10-CM | POA: Diagnosis not present

## 2019-06-24 DIAGNOSIS — E7800 Pure hypercholesterolemia, unspecified: Secondary | ICD-10-CM | POA: Insufficient documentation

## 2019-06-25 DIAGNOSIS — M542 Cervicalgia: Secondary | ICD-10-CM | POA: Diagnosis not present

## 2019-06-25 DIAGNOSIS — M545 Low back pain: Secondary | ICD-10-CM | POA: Diagnosis not present

## 2019-06-25 DIAGNOSIS — M533 Sacrococcygeal disorders, not elsewhere classified: Secondary | ICD-10-CM | POA: Diagnosis not present

## 2019-06-26 DIAGNOSIS — M533 Sacrococcygeal disorders, not elsewhere classified: Secondary | ICD-10-CM | POA: Insufficient documentation

## 2019-06-26 DIAGNOSIS — M542 Cervicalgia: Secondary | ICD-10-CM | POA: Insufficient documentation

## 2019-07-22 DIAGNOSIS — R82998 Other abnormal findings in urine: Secondary | ICD-10-CM | POA: Diagnosis not present

## 2019-07-22 DIAGNOSIS — Z1159 Encounter for screening for other viral diseases: Secondary | ICD-10-CM | POA: Diagnosis not present

## 2019-07-22 DIAGNOSIS — R1084 Generalized abdominal pain: Secondary | ICD-10-CM | POA: Diagnosis not present

## 2019-07-22 DIAGNOSIS — D539 Nutritional anemia, unspecified: Secondary | ICD-10-CM | POA: Diagnosis not present

## 2019-07-22 DIAGNOSIS — R197 Diarrhea, unspecified: Secondary | ICD-10-CM | POA: Diagnosis not present

## 2019-07-22 DIAGNOSIS — N915 Oligomenorrhea, unspecified: Secondary | ICD-10-CM | POA: Diagnosis not present

## 2019-09-16 DIAGNOSIS — N915 Oligomenorrhea, unspecified: Secondary | ICD-10-CM | POA: Diagnosis not present

## 2019-09-16 DIAGNOSIS — R3989 Other symptoms and signs involving the genitourinary system: Secondary | ICD-10-CM | POA: Diagnosis not present

## 2019-09-16 DIAGNOSIS — B373 Candidiasis of vulva and vagina: Secondary | ICD-10-CM | POA: Diagnosis not present

## 2019-10-05 DIAGNOSIS — M9903 Segmental and somatic dysfunction of lumbar region: Secondary | ICD-10-CM | POA: Diagnosis not present

## 2019-10-05 DIAGNOSIS — S39012A Strain of muscle, fascia and tendon of lower back, initial encounter: Secondary | ICD-10-CM | POA: Diagnosis not present

## 2019-10-05 DIAGNOSIS — M9901 Segmental and somatic dysfunction of cervical region: Secondary | ICD-10-CM | POA: Diagnosis not present

## 2019-10-05 DIAGNOSIS — S29012A Strain of muscle and tendon of back wall of thorax, initial encounter: Secondary | ICD-10-CM | POA: Diagnosis not present

## 2019-10-05 DIAGNOSIS — S134XXA Sprain of ligaments of cervical spine, initial encounter: Secondary | ICD-10-CM | POA: Diagnosis not present

## 2019-10-05 DIAGNOSIS — M9902 Segmental and somatic dysfunction of thoracic region: Secondary | ICD-10-CM | POA: Diagnosis not present

## 2019-11-09 DIAGNOSIS — M9901 Segmental and somatic dysfunction of cervical region: Secondary | ICD-10-CM | POA: Diagnosis not present

## 2019-11-09 DIAGNOSIS — M9902 Segmental and somatic dysfunction of thoracic region: Secondary | ICD-10-CM | POA: Diagnosis not present

## 2019-11-09 DIAGNOSIS — M9903 Segmental and somatic dysfunction of lumbar region: Secondary | ICD-10-CM | POA: Diagnosis not present

## 2019-11-09 DIAGNOSIS — S29012A Strain of muscle and tendon of back wall of thorax, initial encounter: Secondary | ICD-10-CM | POA: Diagnosis not present

## 2019-11-09 DIAGNOSIS — S134XXA Sprain of ligaments of cervical spine, initial encounter: Secondary | ICD-10-CM | POA: Diagnosis not present

## 2019-11-09 DIAGNOSIS — S39012A Strain of muscle, fascia and tendon of lower back, initial encounter: Secondary | ICD-10-CM | POA: Diagnosis not present

## 2020-04-07 DIAGNOSIS — Z6830 Body mass index (BMI) 30.0-30.9, adult: Secondary | ICD-10-CM | POA: Diagnosis not present

## 2020-04-07 DIAGNOSIS — M25562 Pain in left knee: Secondary | ICD-10-CM | POA: Diagnosis not present

## 2020-07-16 DIAGNOSIS — N914 Secondary oligomenorrhea: Secondary | ICD-10-CM | POA: Diagnosis not present

## 2020-07-16 DIAGNOSIS — B373 Candidiasis of vulva and vagina: Secondary | ICD-10-CM | POA: Diagnosis not present

## 2020-07-16 DIAGNOSIS — Z6832 Body mass index (BMI) 32.0-32.9, adult: Secondary | ICD-10-CM | POA: Diagnosis not present

## 2020-08-14 ENCOUNTER — Encounter (HOSPITAL_COMMUNITY): Payer: Self-pay

## 2020-08-14 ENCOUNTER — Ambulatory Visit (HOSPITAL_COMMUNITY)
Admission: EM | Admit: 2020-08-14 | Discharge: 2020-08-14 | Disposition: A | Discharge status: Home / Self Care | Payer: Medicaid Other

## 2020-08-14 ENCOUNTER — Other Ambulatory Visit: Payer: Self-pay

## 2020-08-14 DIAGNOSIS — J069 Acute upper respiratory infection, unspecified: Secondary | ICD-10-CM

## 2020-08-14 NOTE — ED Triage Notes (Signed)
Pt in with c/o ST, congestion, & cough that has been going on for 3 days  Pt also c/o facial itchiness that started today  Pt has been taking dayquil for sxs

## 2020-08-14 NOTE — Discharge Instructions (Addendum)
Recommend allergy medication like Zyrtec or Claritin Recommend Flonase Push fluids Take ibuprofen as needed for sore throat, headache.

## 2020-08-14 NOTE — ED Provider Notes (Signed)
[facility]    CSN: [id number] Arrival date & time: 08/14/20  1607      History   Chief Complaint Chief Complaint  Patient presents with  . Sore Throat  . Nasal Congestion  . Cough  . Facial itchiness    HPI Sally Shannon is a 25 y.o. female.   Pt complains of sore throat, postnasal drip, congestion, and intermittent cough that started 3 days ago.  Denies fever, chills, n/v/d, body aches.  Hast taken generic dayquil with minimal relief.  She denies sick contacts.  She has had her COVID vaccine.      Past Medical History:  Diagnosis Date  . Anemia     Patient Active Problem List   Diagnosis Date Noted  . Right knee pain 06/21/2017  . Hemorrhoids during pregnancy 04/11/2016  . Yeast vaginitis 02/10/2016  . Pain in joint, ankle and foot 10/06/2013  . Nevus, non-neoplastic 10/06/2013    Past Surgical History:  Procedure Laterality Date  . WISDOM TOOTH EXTRACTION      OB History    Gravida  1   Para  1   Term  1   Preterm      AB      Living  1     SAB      IAB      Ectopic      Multiple  0   Live Births  1            Home Medications    Prior to Admission medications   Medication Sig Start Date End Date Taking? Authorizing Provider  docusate sodium (COLACE) 100 MG capsule Take 1 capsule (100 mg total) by mouth 2 (two) times daily. Patient not taking: Reported on 03/09/2018 06/28/16   Judeth Horn, NP  Prenatal Vit-Fe Fumarate-FA (PRENATAL MULTIVITAMIN) TABS tablet Take 1 tablet by mouth daily at 12 noon.    [provider]    Family History Family History  Problem Relation Age of Onset  . Anemia Mother   . Depression Mother   . Diabetes Mother   . Anemia Maternal Grandmother     Social History Social History   Tobacco Use  . Smoking status: Never Smoker  . Smokeless tobacco: Never Used  Substance Use Topics  . Alcohol use: No    Alcohol/week: 0.0 standard drinks  . Drug use: No     Allergies    Patient has no known allergies.   Review of Systems Review of Systems  Constitutional: Negative for chills and fever.  HENT: Positive for congestion, postnasal drip and sore throat. Negative for ear pain.   Eyes: Negative for pain and visual disturbance.  Respiratory: Positive for cough. Negative for shortness of breath.   Cardiovascular: Negative for chest pain and palpitations.  Gastrointestinal: Negative for abdominal pain, diarrhea, nausea and vomiting.  Genitourinary: Negative for dysuria and hematuria.  Musculoskeletal: Negative for arthralgias and back pain.  Skin: Negative for color change and rash.  Neurological: Negative for seizures and syncope.  All other systems reviewed and are negative.    Physical Exam Triage Vital Signs ED Triage Vitals  Enc Vitals Group     BP 08/14/20 1650 124/72     Pulse Rate 08/14/20 1649 83     Resp 08/14/20 1649 16     Temp 08/14/20 1649 98.7 F (37.1 C)     Temp src --      SpO2 08/14/20 1649 100 %     Weight --  Height --      Head Circumference --      Peak Flow --      Pain Score 08/14/20 1647 7     Pain Loc --      Pain Edu? --      Excl. in GC? --    No data found.  Updated Vital Signs BP 124/72   Pulse 83   Temp 98.7 F (37.1 C)   Resp 16   LMP 07/24/2020   SpO2 100%   Breastfeeding No   Visual Acuity Right Eye Distance:   Left Eye Distance:   Bilateral Distance:    Right Eye Near:   Left Eye Near:    Bilateral Near:     Physical Exam Vitals and nursing note reviewed.  Constitutional:      General: She is not in acute distress.    Appearance: She is well-developed.  HENT:     Head: Normocephalic and atraumatic.     Mouth/Throat:     Pharynx: Oropharynx is clear. No pharyngeal swelling, oropharyngeal exudate, posterior oropharyngeal erythema or uvula swelling.  Eyes:     Conjunctiva/sclera: Conjunctivae normal.  Cardiovascular:     Rate and Rhythm: Normal rate and regular rhythm.     Heart  sounds: No murmur heard.   Pulmonary:     Effort: Pulmonary effort is normal. No respiratory distress.     Breath sounds: Normal breath sounds.  Abdominal:     Palpations: Abdomen is soft.     Tenderness: There is no abdominal tenderness.  Musculoskeletal:     Cervical back: Neck supple.  Skin:    General: Skin is warm and dry.  Neurological:     Mental Status: She is alert.      UC Treatments / Results  Labs (all labs ordered are listed, but only abnormal results are displayed) Labs Reviewed - No data to display  EKG   Radiology No results found.  Procedures Procedures (including critical care time)  Medications Ordered in UC Medications - No data to display  Initial Impression / Assessment and Plan / UC Course  I have reviewed the triage vital signs and the nursing notes.  Pertinent labs & imaging results that were available during my care of the patient were reviewed by me and considered in my medical decision making (see chart for details).    Viral URI. Advised Zyrtec and Flonase. Push fluids.  She can take ibuprofen as needed. Return precautions discussed.  Final Clinical Impressions(s) / UC Diagnoses   Final diagnoses:  Viral URI with cough     Discharge Instructions     Recommend allergy medication like Zyrtec or Claritin Recommend Flonase Push fluids Take ibuprofen as needed for sore throat, headache.    ED Prescriptions    None     PDMP not reviewed this encounter.   Jodell Cipro, PA-C 08/14/20 1728

## 2020-08-26 ENCOUNTER — Ambulatory Visit (INDEPENDENT_AMBULATORY_CARE_PROVIDER_SITE_OTHER): Payer: Medicaid Other | Admitting: Obstetrics and Gynecology

## 2020-08-26 ENCOUNTER — Other Ambulatory Visit (HOSPITAL_COMMUNITY)
Admission: RE | Admit: 2020-08-26 | Discharge: 2020-08-26 | Disposition: A | Discharge status: Home / Self Care | Payer: Medicaid Other | Source: Ambulatory Visit | Attending: Obstetrics and Gynecology | Admitting: Obstetrics and Gynecology

## 2020-08-26 ENCOUNTER — Encounter: Payer: Self-pay | Admitting: Obstetrics and Gynecology

## 2020-08-26 VITALS — BP 111/61 | HR 75 | Wt 189.9 lb

## 2020-08-26 DIAGNOSIS — Z124 Encounter for screening for malignant neoplasm of cervix: Secondary | ICD-10-CM | POA: Diagnosis present

## 2020-08-26 DIAGNOSIS — Z30432 Encounter for removal of intrauterine contraceptive device: Secondary | ICD-10-CM

## 2020-08-26 NOTE — Progress Notes (Signed)
Paragard inserted 06/28/16; pt requesting removal to try for pregnancy.

## 2020-08-26 NOTE — Progress Notes (Signed)
25 yo P1 with Paraguard IUD since 06/2016 presenting today for IUD removal as she would like to conceive. Patient is without any complaints. She reports a monthly period. She denies any pelvic pain or abnormal discharge  Past Medical History:  Diagnosis Date  . Anemia    Past Surgical History:  Procedure Laterality Date  . WISDOM TOOTH EXTRACTION     Family History  Problem Relation Age of Onset  . Anemia Mother   . Depression Mother   . Diabetes Mother   . Anemia Maternal Grandmother    Social History   Tobacco Use  . Smoking status: Never Smoker  . Smokeless tobacco: Never Used  Substance Use Topics  . Alcohol use: No    Alcohol/week: 0.0 standard drinks  . Drug use: No   ROS See pertinent in HPI. All other systems reviewed and non contributory  Blood pressure 111/61, pulse 75, weight 189 lb 14.4 oz (86.1 kg), last menstrual period 08/19/2020.  GENERAL: Well-developed, well-nourished female in no acute distress.  HEENT: Normocephalic, atraumatic. Sclerae anicteric.  NECK: Supple. Normal thyroid.  LUNGS: Clear to auscultation bilaterally.  HEART: Regular rate and rhythm. BREASTS: Symmetric in size. No palpable masses or lymphadenopathy, skin changes, or nipple drainage. ABDOMEN: Soft, nontender, nondistended. No organomegaly. PELVIC: Normal external female genitalia. Vagina is pink and rugated.  Normal discharge. Normal appearing cervix. Uterus is normal in size.  No adnexal mass or tenderness. EXTREMITIES: No cyanosis, clubbing, or edema, 2+ distal pulses.  A/P 25 yo  - Pap smear collected. STI testing from pap smear. Patient declined blood work GYNECOLOGY CLINIC PROCEDURE NOTE IUD Removal  Patient identified, informed consent performed, consent signed.  Patient was in the dorsal lithotomy position, normal external genitalia was noted.  A speculum was placed in the patient's vagina, normal discharge was noted, no lesions. The cervix was visualized, no lesions, no  abnormal discharge.  The strings of the IUD were grasped and pulled using ring forceps. The IUD was removed in its entirety. Patient tolerated the procedure well.    Patient plans for pregnancy soon and she was told to avoid teratogens, take PNV and folic acid.  Routine preventative health maintenance measures emphasized.

## 2020-08-27 LAB — CYTOLOGY - PAP
Chlamydia: NEGATIVE
Comment: NEGATIVE
Comment: NEGATIVE
Comment: NORMAL
Diagnosis: NEGATIVE
Neisseria Gonorrhea: NEGATIVE
Trichomonas: NEGATIVE

## 2021-01-14 DIAGNOSIS — M65322 Trigger finger, left index finger: Secondary | ICD-10-CM | POA: Insufficient documentation

## 2021-03-01 ENCOUNTER — Other Ambulatory Visit: Payer: Self-pay

## 2021-03-01 ENCOUNTER — Ambulatory Visit (HOSPITAL_COMMUNITY)
Admission: EM | Admit: 2021-03-01 | Discharge: 2021-03-01 | Disposition: A | Discharge status: Home / Self Care | Payer: BLUE CROSS/BLUE SHIELD | Attending: Emergency Medicine | Admitting: Emergency Medicine

## 2021-03-01 DIAGNOSIS — R6889 Other general symptoms and signs: Secondary | ICD-10-CM | POA: Diagnosis not present

## 2021-03-01 LAB — POC INFLUENZA A AND B ANTIGEN (URGENT CARE ONLY)
INFLUENZA A ANTIGEN, POC: NEGATIVE
INFLUENZA B ANTIGEN, POC: NEGATIVE

## 2021-03-01 MED ORDER — BENZONATATE 100 MG PO CAPS: 100.0000 mg | ORAL_CAPSULE | Freq: Two times a day (BID) | ORAL | 0 refills | Status: DC | PRN

## 2021-03-01 MED ORDER — ONDANSETRON 4 MG PO TBDP: 4.0000 mg | ORAL_TABLET | Freq: Three times a day (TID) | ORAL | 0 refills | Status: DC | PRN

## 2021-03-01 NOTE — ED Provider Notes (Signed)
[facility]    CSN: [id number] Arrival date & time: 03/01/21  0805      History   Chief Complaint Chief Complaint  Patient presents with   Sore Throat   Emesis   Fatigue    HPI Sally Shannon is a 25 y.o. female.   Patient presents to the emergency department with a chief complaint of body aches, sore throat, cough, and fatigue.  Onset of symptoms was Friday (3 days ago).  Patient reports subjective fevers, but never measured her temperature.  She states that her symptoms are worsened with swallowing.  She has not taken anything for her symptoms.  She denies having gotten a flu shot this year.  She states that she has felt slightly dizzy, and did not have any energy for the past few days.  The history is provided by the patient. A language interpreter was used.   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 Authorizing Provider  benzonatate (TESSALON) 100 MG capsule Take 1 capsule (100 mg total) by mouth 2 (two) times daily as needed for cough. 03/01/21  Yes Roxy Horseman, PA-C  ondansetron (ZOFRAN ODT) 4 MG disintegrating tablet Take 1 tablet (4 mg total) by mouth every 8 (eight) hours as needed for nausea or vomiting. 03/01/21  Yes Roxy Horseman, PA-C  Multiple Vitamin (MULTIVITAMIN PO) Take by mouth.    [provider]  Prenatal Vit-Fe Fumarate-FA  (PRENATAL MULTIVITAMIN) TABS tablet Take 1 tablet by mouth daily at 12 noon. Patient not taking: Reported on 08/26/2020    [provider]    Family History Family History  Problem Relation Age of Onset   Anemia Mother    Depression Mother    Diabetes Mother    Anemia Maternal Grandmother     Social History Social History   Tobacco Use   Smoking status: Never   Smokeless tobacco: Never  Substance Use Topics   Alcohol use: No    Alcohol/week: 0.0 standard drinks   Drug use: No     Allergies   Patient has no known allergies.   Review of Systems Review of Systems  All other systems reviewed and are negative.   Physical Exam Triage Vital Signs ED Triage Vitals  Enc Vitals Group     BP 03/01/21 0833 110/75     Pulse Rate 03/01/21 0833 99     Resp 03/01/21 0833 20     Temp 03/01/21 0833 98.7 F (37.1 C)     Temp src --      SpO2 03/01/21 0833  100 %     Weight --      Height --      Head Circumference --      Peak Flow --      Pain Score 03/01/21 0832 8     Pain Loc --      Pain Edu? --      Excl. in [place]? --    No data found.  Updated Vital Signs BP 110/75   Pulse 99   Temp 98.7 F (37.1 C)   Resp 20   SpO2 100%   Visual Acuity Right Eye Distance:   Left Eye Distance:   Bilateral Distance:    Right Eye Near:   Left Eye Near:    Bilateral Near:     Physical Exam Vitals and nursing note reviewed.  Constitutional:      General: She is not in acute distress.    Appearance: She is well-developed.     Comments: Coughing during exam  HENT:     Head: Normocephalic and atraumatic.     Mouth/Throat:     Pharynx: Posterior oropharyngeal erythema present.  Eyes:     Conjunctiva/sclera: Conjunctivae normal.  Cardiovascular:     Rate and Rhythm: Normal rate and regular rhythm.     Heart sounds: No murmur heard. Pulmonary:     Effort: Pulmonary effort is normal. No respiratory distress.     Breath sounds: Normal breath sounds.  Abdominal:      Palpations: Abdomen is soft.     Tenderness: There is no abdominal tenderness.  Musculoskeletal:     Cervical back: Neck supple.     Comments: Moves all extremities without difficulty  Skin:    General: Skin is warm and dry.     Comments: No visible rash  Neurological:     Mental Status: She is alert and oriented to person, place, and time.     Comments: Cranial nerves grossly intact  Psychiatric:        Mood and Affect: Mood normal.        Behavior: Behavior normal.     UC Treatments / Results  Labs (all labs ordered are listed, but only abnormal results are displayed) Labs Reviewed  POC INFLUENZA A AND B ANTIGEN (URGENT CARE ONLY)    EKG   Radiology No results found.  Procedures Procedures (including critical care time)  Medications Ordered in UC Medications - No data to display  Initial Impression / Assessment and Plan / UC Course  I have reviewed the triage vital signs and the nursing notes.  Pertinent labs & imaging results that were available during my care of the patient were reviewed by me and considered in my medical decision making (see chart for details).     Patient with symptoms consistent with influenza.  Vitals are stable, low-grade fever.  No signs of dehydration, tolerating PO's.  Lungs are clear. Due to patient's presentation and physical exam a chest x-ray was not ordered bc likely diagnosis of flu.  Discussed the cost versus benefit of Tamiflu treatment with the patient.  The patient understands that symptoms are greater than the recommended 24-48 hour window of treatment.  Patient will be discharged with instructions to orally hydrate, rest, and use over-the-counter medications such as anti-inflammatories ibuprofen and Aleve for muscle aches and Tylenol for fever.  Patient will also be given a cough suppressant.   Final Clinical Impressions(s) / UC Diagnoses   Final diagnoses:  Flu-like symptoms     Discharge  Instructions      Your  symptoms may be consistent with influenza.  We have tested you today.  Follow-up on your results in myChart.  If your symptoms change or worsen, please make an appointment with your doctor.   ED Prescriptions     Medication Sig Dispense Auth. Provider   ondansetron (ZOFRAN ODT) 4 MG disintegrating tablet Take 1 tablet (4 mg total) by mouth every 8 (eight) hours as needed for nausea or vomiting. 10 tablet Montine Circle, PA-C   benzonatate (TESSALON) 100 MG capsule Take 1 capsule (100 mg total) by mouth 2 (two) times daily as needed for cough. 20 capsule Montine Circle, PA-C      PDMP not reviewed this encounter.   Montine Circle, PA-C 03/01/21 [phone]

## 2021-03-01 NOTE — ED Triage Notes (Signed)
Pt reports since Friday she has had a sore throat,fatigue ,body aches .

## 2021-03-01 NOTE — Discharge Instructions (Signed)
Your symptoms may be consistent with influenza.  We have tested you today.  Follow-up on your results in myChart.  If your symptoms change or worsen, please make an appointment with your doctor.

## 2021-12-06 DIAGNOSIS — G5601 Carpal tunnel syndrome, right upper limb: Secondary | ICD-10-CM | POA: Insufficient documentation

## 2022-06-04 ENCOUNTER — Ambulatory Visit (HOSPITAL_COMMUNITY)
Admission: EM | Admit: 2022-06-04 | Discharge: 2022-06-04 | Disposition: A | Discharge status: Home / Self Care | Payer: Commercial Managed Care - HMO | Attending: Physician Assistant | Admitting: Physician Assistant

## 2022-06-04 ENCOUNTER — Encounter (HOSPITAL_COMMUNITY): Payer: Self-pay

## 2022-06-04 DIAGNOSIS — J069 Acute upper respiratory infection, unspecified: Secondary | ICD-10-CM | POA: Insufficient documentation

## 2022-06-04 DIAGNOSIS — G4489 Other headache syndrome: Secondary | ICD-10-CM | POA: Insufficient documentation

## 2022-06-04 DIAGNOSIS — J029 Acute pharyngitis, unspecified: Secondary | ICD-10-CM | POA: Diagnosis present

## 2022-06-04 LAB — POCT RAPID STREP A, ED / UC: Streptococcus, Group A Screen (Direct): NEGATIVE

## 2022-06-04 MED ORDER — METOCLOPRAMIDE HCL 5 MG/ML IJ SOLN
INTRAMUSCULAR | Status: AC
  Filled 2022-06-04: qty 2

## 2022-06-04 MED ORDER — KETOROLAC TROMETHAMINE 30 MG/ML IJ SOLN
30.0000 mg | Freq: Once | INTRAMUSCULAR | Status: AC
Start: 2022-06-04 — End: 2022-06-04
  Administered 2022-06-04: 30 mg via INTRAMUSCULAR

## 2022-06-04 MED ORDER — KETOROLAC TROMETHAMINE 30 MG/ML IJ SOLN
INTRAMUSCULAR | Status: AC
  Filled 2022-06-04: qty 1

## 2022-06-04 MED ORDER — DEXAMETHASONE SODIUM PHOSPHATE 10 MG/ML IJ SOLN
10.0000 mg | Freq: Once | INTRAMUSCULAR | Status: AC
Start: 2022-06-04 — End: 2022-06-04
  Administered 2022-06-04: 10 mg via INTRAMUSCULAR

## 2022-06-04 MED ORDER — METOCLOPRAMIDE HCL 5 MG/ML IJ SOLN
5.0000 mg | Freq: Once | INTRAMUSCULAR | Status: AC
  Administered 2022-06-04: 5 mg via INTRAMUSCULAR

## 2022-06-04 MED ORDER — DEXAMETHASONE SODIUM PHOSPHATE 10 MG/ML IJ SOLN
INTRAMUSCULAR | Status: AC
  Filled 2022-06-04: qty 1

## 2022-06-04 MED ORDER — ONDANSETRON 4 MG PO TBDP: 4.0000 mg | ORAL_TABLET | Freq: Three times a day (TID) | ORAL | 0 refills | Status: DC | PRN

## 2022-06-04 NOTE — ED Triage Notes (Signed)
Chief Complaint: migraine, cough, sneezing, fever, burning throat.   Onset: 5 days   Prescriptions or OTC medications tried: Yes- tylenol    with no relief  Sick exposure: Yes- her mother   New foods, medications, or products: No  Recent Travel: No

## 2022-06-04 NOTE — Discharge Instructions (Addendum)
Advised to take the Zofran 4 mg every 6 hours as needed for nausea.  Advised take ibuprofen or Tylenol as needed for fever, body aches pain and discomfort.  Advised to follow-up PCP or return to urgent care if symptoms fail to improve.

## 2022-06-04 NOTE — ED Provider Notes (Signed)
[place] [facility] [place] [facility]    CSN: [id number] Arrival date & time: 06/04/22  1004      History   Chief Complaint Chief Complaint  Patient presents with   URI    HPI Sally Shannon is a 27 y.o. female.   27 year old female presents with headache, sore throat, congestion.  Patient indicates for the past 5 days she has been having some upper respiratory congestion with rhinitis, postnasal drip, headache which is mainly frontal which she describes as constant and has been present for the past several days, she relates it is not relieved with Tylenol.  Patient also indicates that she has been having production which is mainly been clear over the past 2 days but before it was discolored yellowish-green.  She indicates that she has been having sore throat and painful swallowing over the past several days.  She describes the sore throat is like swallowing razor blades.  She indicates she has been having some chest congestion with intermittent cough, production has been clear to yellow.  She indicates she does not have any wheezing.  She has had mild fever body aches and muscle soreness over the past couple days.  She indicates she has had some mild nausea with 1 episode of vomiting mainly due to getting choked from some medicine.  She has been around another family member that has been sick with similar symptoms.  She is tolerating fluids well.   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 Authorizing Provider  benzonatate (TESSALON) 100 MG capsule Take 1 capsule (100 mg total) by mouth 2 (two) times daily as needed for cough. 03/01/21   Montine Circle, PA-C  Multiple Vitamin (MULTIVITAMIN PO) Take by mouth.    [provider]  ondansetron (ZOFRAN ODT) 4 MG disintegrating tablet Take 1 tablet (4 mg total) by mouth every 8 (eight) hours as needed for nausea or vomiting. 06/04/22   Nyoka Lint, PA-C  Prenatal Vit-Fe Fumarate-FA (PRENATAL MULTIVITAMIN) TABS tablet Take 1 tablet by mouth daily at 12 noon. Patient not taking: Reported on 08/26/2020    [provider]    Family History Family History  Problem Relation Age of Onset   Anemia Mother    Depression Mother    Diabetes Mother    Anemia Maternal Grandmother     Social History Social History   Tobacco Use   Smoking status: Never   Smokeless tobacco: Never  Substance Use  Topics   Alcohol use: No    Alcohol/week: 0.0 standard drinks of alcohol   Drug use: No     Allergies   Patient has no known allergies.   Review of Systems Review of Systems  HENT:  Positive for postnasal drip, rhinorrhea, sinus pressure, sinus pain and sore throat.   Respiratory:  Positive for cough.   Neurological:  Positive for headaches.     Physical Exam Triage Vital Signs ED Triage Vitals  Enc Vitals Group     BP 06/04/22 1026 126/85     Pulse Rate 06/04/22 1026 (!) 109     Resp 06/04/22 1026 18     Temp 06/04/22 1026 99.1 F (37.3 C)     Temp Source 06/04/22 1026 Oral     SpO2 06/04/22 1026 98 %     Weight 06/04/22 1026 187 lb (84.8 kg)     Height 06/04/22 1026 5\' 4"  (1.626 m)     Head Circumference --      Peak Flow --      Pain Score 06/04/22 1025 8     Pain Loc --      Pain Edu? --       Excl. in [place]? --    No data found.  Updated Vital Signs BP 126/85   Pulse (!) 109   Temp 99.1 F (37.3 C) (Oral)   Resp 18   Ht 5\' 4"  (1.626 m)   Wt 187 lb (84.8 kg)   LMP 05/29/2022 (Approximate)   SpO2 98%   BMI 32.10 kg/m   Visual Acuity Right Eye Distance:   Left Eye Distance:   Bilateral Distance:    Right Eye Near:   Left Eye Near:    Bilateral Near:     Physical Exam Constitutional:      Appearance: Normal appearance.  HENT:     Right Ear: Tympanic membrane and ear canal normal.     Left Ear: Tympanic membrane and ear canal normal.     Mouth/Throat:     Mouth: Mucous membranes are moist.     Pharynx: Posterior oropharyngeal erythema present. No oropharyngeal exudate.  Cardiovascular:     Rate and Rhythm: Normal rate and regular rhythm.     Heart sounds: Normal heart sounds.  Pulmonary:     Effort: Pulmonary effort is normal.     Breath sounds: Normal breath sounds and air entry. No wheezing, rhonchi or rales.  Lymphadenopathy:     Cervical: No cervical adenopathy.  Neurological:     Mental Status: She is alert.      UC Treatments / Results  Labs (all labs ordered are listed, but only abnormal results are displayed) Labs Reviewed  CULTURE, GROUP A STREP [facility])  POCT RAPID STREP A, ED / UC    EKG   Radiology No results found.  Procedures Procedures (including critical care time)  Medications Ordered in UC Medications  ketorolac (TORADOL) 30 MG/ML injection 30 mg (30 mg Intramuscular Given 06/04/22 1112)  metoCLOPramide (REGLAN) injection 5 mg (5 mg Intramuscular Given 06/04/22 1114)  dexamethasone (DECADRON) injection 10 mg (10 mg Intramuscular Given 06/04/22 1113)    Initial Impression / Assessment and Plan / UC Course  I have reviewed the triage vital signs and the nursing notes.  Pertinent labs & imaging results that were available during my care of the patient were reviewed by me and considered in my medical decision making (see chart for  details).    Plan: The diagnosis  will be treated with the following: 1.  Headache: A.  Headache medication and: Decadron-Toradol-Reglan given IM to help relieve the headache. 2.  Sore throat: A.  Advised to continue salt water gargles, lozenges to help soothe the throat. B.  Throat culture is pending 3.  Upper respiratory infection: A.  Advised to use natural components or OTC medications to help control cough and congestion. B.  Tylenol or ibuprofen for fever and body discomfort. 4.  Advised follow-up PCP or return to urgent care as needed. Final Clinical Impressions(s) / UC Diagnoses   Final diagnoses:  Viral upper respiratory tract infection  Sore throat  Other headache syndrome     Discharge Instructions      Advised to take the Zofran 4 mg every 6 hours as needed for nausea.  Advised take ibuprofen or Tylenol as needed for fever, body aches pain and discomfort.  Advised to follow-up PCP or return to urgent care if symptoms fail to improve.     ED Prescriptions     Medication Sig Dispense Auth. Provider   ondansetron (ZOFRAN ODT) 4 MG disintegrating tablet Take 1 tablet (4 mg total) by mouth every 8 (eight) hours as needed for nausea or vomiting. 10 tablet Nyoka Lint, PA-C      PDMP not reviewed this encounter.   Nyoka Lint, PA-C 06/04/22 1127

## 2022-06-07 LAB — CULTURE, GROUP A STREP (THRC)

## 2022-11-21 ENCOUNTER — Ambulatory Visit: Payer: Commercial Managed Care - HMO | Admitting: Family Medicine

## 2023-01-12 ENCOUNTER — Ambulatory Visit: Payer: Self-pay | Admitting: Family Medicine

## 2023-03-15 ENCOUNTER — Ambulatory Visit (INDEPENDENT_AMBULATORY_CARE_PROVIDER_SITE_OTHER): Payer: Self-pay | Admitting: Internal Medicine

## 2023-03-15 VITALS — BP 122/70 | HR 80 | Temp 98.2°F | Ht 64.0 in | Wt 210.0 lb

## 2023-03-15 DIAGNOSIS — E785 Hyperlipidemia, unspecified: Secondary | ICD-10-CM

## 2023-03-15 DIAGNOSIS — D649 Anemia, unspecified: Secondary | ICD-10-CM

## 2023-03-15 DIAGNOSIS — M65322 Trigger finger, left index finger: Secondary | ICD-10-CM

## 2023-03-15 DIAGNOSIS — E7800 Pure hypercholesterolemia, unspecified: Secondary | ICD-10-CM

## 2023-03-15 DIAGNOSIS — E559 Vitamin D deficiency, unspecified: Secondary | ICD-10-CM

## 2023-03-15 DIAGNOSIS — O99013 Anemia complicating pregnancy, third trimester: Secondary | ICD-10-CM | POA: Insufficient documentation

## 2023-03-15 NOTE — Progress Notes (Signed)
Patient ID: Sally Shannon, female   DOB: 1996-02-09, 27 y.o.   MRN: [id number]        Chief Complaint:   New Pt with persistent index finger trigger finger, hld, low vit d       HPI:  Sally Shannon is a 27 y.o. female here with c/o persistent index finger right hand numbness trigger finger; was seen earlier this yr per hand surgury, but asking for second opinion, maybe MRI.  Pt denies chest pain, increased sob or doe, wheezing, orthopnea, PND, increased LE swelling, palpitations, dizziness or syncope.   Pt denies polydipsia, polyuria, or new focal neuro s/s.    Pt denies fever, wt loss, night sweats, loss of appetite, or other constitutional symptoms  Not taking Vit D       Wt Readings from Last 3 Encounters:  03/15/23 210 lb (95.3 kg)  06/04/22 187 lb (84.8 kg)  08/26/20 189 lb 14.4 oz (86.1 kg)   BP Readings from Last 3 Encounters:  03/15/23 122/70  06/04/22 126/85  03/01/21 110/75         Past Medical History:  Diagnosis Date   Anemia    HLD (hyperlipidemia)    Vitamin D deficiency    Past Surgical History:  Procedure Laterality Date   WISDOM TOOTH EXTRACTION      reports that she has never smoked. She has never used smokeless tobacco. She reports that she does not drink alcohol and does not use drugs. family history includes Anemia in her maternal grandmother and mother; Depression in her mother; Diabetes in her mother. No Known Allergies Current Outpatient Medications on File Prior to Visit  Medication Sig Dispense Refill   benzonatate (TESSALON) 100 MG capsule Take 1 capsule (100 mg total) by mouth 2 (two) times daily as needed for cough. (Patient not taking: Reported on 03/15/2023) 20 capsule 0   Multiple Vitamin (MULTIVITAMIN PO) Take by mouth. (Patient not taking: Reported on 03/15/2023)     ondansetron (ZOFRAN ODT) 4 MG disintegrating tablet Take 1 tablet (4 mg total) by mouth every 8 (eight) hours as needed for nausea or vomiting. (Patient not taking: Reported on 03/15/2023)  10 tablet 0   Prenatal Vit-Fe Fumarate-FA (PRENATAL MULTIVITAMIN) TABS tablet Take 1 tablet by mouth daily at 12 noon. (Patient not taking: Reported on 08/26/2020)     No current facility-administered medications on file prior to visit.        ROS:  All others reviewed and negative.  Objective        PE:  BP 122/70 (BP Location: Left Arm, Patient Position: Sitting, Cuff Size: Normal)   Pulse 80   Temp 98.2 F (36.8 C) (Oral)   Ht 5\' 4"  (1.626 m)   Wt 210 lb (95.3 kg)   LMP 03/01/2023 (Exact Date)   SpO2 97%   BMI 36.05 kg/m                 Constitutional: Pt appears in NAD               HENT: Head: NCAT.                Right Ear: External ear normal.                 Left Ear: External ear normal.                Eyes: . Pupils are equal, round, and reactive to light. Conjunctivae and EOM are normal  Nose: without d/c or deformity               Neck: Neck supple. Gross normal ROM               Cardiovascular: Normal rate and regular rhythm.                 Pulmonary/Chest: Effort normal and breath sounds without rales or wheezing.                Abd:  Soft, NT, ND, + BS, no organomegaly               Neurological: Pt is alert. At baseline orientation, motor grossly intact               Skin: Skin is warm. No rashes, no other new lesions, LE edema - none               Psychiatric: Pt behavior is normal without agitation   Micro: none  Cardiac tracings I have personally interpreted today:  none  Pertinent Radiological findings (summarize): none   Lab Results  Component Value Date   WBC 10.5 05/19/2016   HGB 13.2 05/19/2016   HCT 38.7 05/19/2016   PLT 336 05/19/2016   GLUCOSE 94 06/18/2014   ALT 16 06/18/2014   AST 22 06/18/2014   NA 137 06/18/2014   K 4.1 06/18/2014   CL 107 06/18/2014   CREATININE 0.65 06/18/2014   BUN 13 06/18/2014   CO2 26 06/18/2014   TSH 1.330 03/10/2014   INR 0.99 10/15/2013   Assessment/Plan:  Sally Shannon is a 27 y.o. Black or  African American [2] female with  has a past medical history of Anemia, HLD (hyperlipidemia), and Vitamin D deficiency.  Vitamin D deficiency Please take OTC Vitamin D3 at 2000 units per day, indefinitely  HLD (hyperlipidemia)  Uncontrolled,, pt for lower chol diet, and f/u lab next visit   Anemia Lab Results  Component Value Date   WBC 10.5 05/19/2016   HGB 13.2 05/19/2016   HCT 38.7 05/19/2016   MCV 77.2 (L) 05/19/2016   PLT 336 05/19/2016   Stable, continue to follow, no recent overt bleeding or heavy menses  Acquired trigger finger of left index finger With persistent symptoms mild, for refer local hand surgury,  to f/u any worsening symptoms or concerns  Followup: Return in about 1 year (around 03/14/2024).  Oliver Clairmont, MD 03/18/2023 1:25 PM [facility]

## 2023-03-15 NOTE — Patient Instructions (Signed)
Please take OTC Vitamin D3 at 2000 units per day, indefinitely  Please continue all other medications as before, and refills have been done if requested.  Please have the pharmacy call with any other refills you may need.  Please continue your efforts at being more active, low cholesterol diet, and weight control.  Please keep your appointments with your specialists as you may have planned  You will be contacted regarding the referral for: Dr Amanda Pea - hand surgury  Please make an Appointment to return for your 1 year visit, or sooner if needed

## 2023-03-18 ENCOUNTER — Encounter: Payer: Self-pay | Admitting: Internal Medicine

## 2023-03-18 NOTE — Assessment & Plan Note (Signed)
Please take OTC Vitamin D3 at 2000 units per day, indefinitely.  

## 2023-03-18 NOTE — Assessment & Plan Note (Signed)
With persistent symptoms mild, for refer local hand surgury,  to f/u any worsening symptoms or concerns

## 2023-03-18 NOTE — Assessment & Plan Note (Signed)
Lab Results  Component Value Date   WBC 10.5 05/19/2016   HGB 13.2 05/19/2016   HCT 38.7 05/19/2016   MCV 77.2 (L) 05/19/2016   PLT 336 05/19/2016   Stable, continue to follow, no recent overt bleeding or heavy menses

## 2023-03-18 NOTE — Assessment & Plan Note (Signed)
  Uncontrolled,, pt for lower chol diet, and f/u lab next visit

## 2023-04-10 ENCOUNTER — Ambulatory Visit: Payer: Self-pay

## 2023-04-10 DIAGNOSIS — O3680X0 Pregnancy with inconclusive fetal viability, not applicable or unspecified: Secondary | ICD-10-CM

## 2023-04-10 DIAGNOSIS — Z3201 Encounter for pregnancy test, result positive: Secondary | ICD-10-CM

## 2023-04-10 DIAGNOSIS — Z3200 Encounter for pregnancy test, result unknown: Secondary | ICD-10-CM

## 2023-04-10 LAB — POCT PREGNANCY, URINE: Preg Test, Ur: POSITIVE — AB

## 2023-04-10 NOTE — Progress Notes (Unsigned)
Possible Pregnancy  Here today to leave urine specimen for pregnancy confirmation. UPT in office today is positive. Pt reports first positive home UPT on 03/30/23. Reviewed dating with patient:   LMP: 02/05/23 EDD: 11/12/2023  9w 1d today  OB history reviewed; full term vaginal birth 2018. Reviewed medications and allergies with patient. Pt reports pain in upper left leg and increased feelings of weakness and fatigue since pregnancy. Reviewed options for pain relief such as Tylenol and warm Epsom salt bath. Reviewed option for 1st trimester [place] to confirm viability and EDD. Explained with sure LMP and absence of pain or bleeding she can decide if she would prefer 1st trimester [place]. Pt is in process of applying for Medicaid. Understands if Medicaid is not approved she would be responsible for cost of [place]. Desires to schedule [place]. Recommended pt take prenatal vitamin and schedule prenatal care. Front office will contact patient.  Marjo Bicker, RN 04/10/2023  5:31 PM

## 2023-04-12 ENCOUNTER — Telehealth: Payer: Self-pay | Admitting: Family Medicine

## 2023-04-12 NOTE — Telephone Encounter (Signed)
Called patient to schedule prenatal care. Patient had a question regarding her U/S that is scheduled for 04/14/23 she says that she was told she would have to pay at least $300 to get it done. She asked if she could get her U/S done here or if there is any way that she can get financial help. I told patient I would send this information to a nurse and I also referred her to [facility] for her Ultrasounds.

## 2023-04-12 NOTE — Telephone Encounter (Signed)
I called Kierstan back and she reports [place] told her she would have to pay $360. I informed her my understanding is you do not have to pay all up front, but tell them you need to make payment plan. I asked if she is applying for medicaid, insurance or financial assistance. She states she is picking up form today and going to apply for medicaid. I explained medicaid if approved will back pay up to a certain amount and she should tell ultrasound that she is in process of applying for medicaid and she should be able to get [place]. I also explained they may ask for a small payment. She voices understanding. Nancy Fetter

## 2023-04-14 ENCOUNTER — Ambulatory Visit (HOSPITAL_COMMUNITY)
Admission: RE | Admit: 2023-04-14 | Discharge: 2023-04-14 | Disposition: A | Discharge status: Home / Self Care | Payer: Medicaid Other | Source: Ambulatory Visit | Attending: Obstetrics and Gynecology | Admitting: Obstetrics and Gynecology

## 2023-04-14 DIAGNOSIS — O3680X0 Pregnancy with inconclusive fetal viability, not applicable or unspecified: Secondary | ICD-10-CM | POA: Diagnosis present

## 2023-04-19 ENCOUNTER — Telehealth: Payer: Self-pay

## 2023-04-19 DIAGNOSIS — Z3481 Encounter for supervision of other normal pregnancy, first trimester: Secondary | ICD-10-CM

## 2023-04-19 DIAGNOSIS — Z3A10 10 weeks gestation of pregnancy: Secondary | ICD-10-CM

## 2023-04-19 DIAGNOSIS — O3680X0 Pregnancy with inconclusive fetal viability, not applicable or unspecified: Secondary | ICD-10-CM

## 2023-04-19 DIAGNOSIS — Z3480 Encounter for supervision of other normal pregnancy, unspecified trimester: Secondary | ICD-10-CM | POA: Insufficient documentation

## 2023-04-19 NOTE — Progress Notes (Signed)
New OB Intake  I connected with Sally Shannon  on 04/19/23 at  3:15 PM EST by MyChart Video Visit and verified that I am speaking with the correct person using two identifiers. Nurse is located at [facility] and pt is located at home.  I discussed the limitations, risks, security and privacy concerns of performing an evaluation and management service by telephone and the availability of in person appointments. I also discussed with the patient that there may be a patient responsible charge related to this service. The patient expressed understanding and agreed to proceed.  I explained I am completing New OB Intake today. We discussed EDD of Not found.. Pt is G2P1001. I reviewed her allergies, medications and Medical/Surgical/OB history.    Patient Active Problem List   Diagnosis Date Noted   Supervision of other normal pregnancy, antepartum 04/19/2023   Vitamin D deficiency    Anemia    HLD (hyperlipidemia)    Carpal tunnel syndrome of right wrist 12/06/2021   Acquired trigger finger of left index finger 01/14/2021   Sacroiliac joint pain 06/26/2019   Cervical spine pain 06/26/2019   Right knee pain 06/21/2017   Pain in joint, ankle and foot 10/06/2013   Nevus, non-neoplastic 10/06/2013    Concerns addressed today  Delivery Plans Plans to deliver at [facility]. Discussed the nature of our practice with multiple providers including residents and students. Due to the size of the practice, the delivering provider may not be the same as those providing prenatal care.   Patient is not interested in water birth. Offered upcoming OB visit with CNM to discuss further.  MyChart/Babyscripts MyChart access verified. I explained pt will have some visits in office and some virtually. Babyscripts instructions given and order placed. Patient verifies receipt of registration text/e-mail. Account successfully created and app downloaded. If patient is a candidate for Optimized scheduling, add to sticky note.    Blood Pressure Cuff/Weight Scale Patient is self-pay; explained patient will be given BP cuff at first prenatal appt. Explained after first prenatal appt pt will check weekly and document in Babyscripts. Patient does have weight scale.  Anatomy US Explained first scheduled [place] will be around 19 weeks.[place] less than 14 weeks scheduled for 06/01/23 at 0130p.  Is patient a CenteringPregnancy candidate?  Accepted   If accepted,    Is patient a Mom+Baby Combined Care candidate?  Not a candidate   If accepted, confirm patient does not intend to move from the area for at least 12 months, then notify Mom+Baby staff  Interested in [place]? If yes, send referral and doula dot phrase.   Is patient a candidate for Babyscripts Optimization? No, due to Centering   First visit review I reviewed new OB appt with patient. Explained pt will be seen by Sally Shannon, CNM at first visit. Discussed Sally Shannon genetic screening with patient. needs Panorama and Horizon.. Routine prenatal labs  needed at [facility] OB visit.    Last Pap Diagnosis  Date Value Ref Range Status  08/26/2020   Final   - Negative for intraepithelial lesion or malignancy (NILM)    Sally Shannon, CMA 04/19/2023  4:13 PM

## 2023-05-01 ENCOUNTER — Ambulatory Visit (HOSPITAL_COMMUNITY)
Admission: RE | Admit: 2023-05-01 | Discharge: 2023-05-01 | Disposition: A | Discharge status: Home / Self Care | Payer: Medicaid Other | Source: Ambulatory Visit | Attending: Obstetrics and Gynecology | Admitting: Obstetrics and Gynecology

## 2023-05-01 DIAGNOSIS — Z3480 Encounter for supervision of other normal pregnancy, unspecified trimester: Secondary | ICD-10-CM | POA: Diagnosis present

## 2023-05-01 DIAGNOSIS — Z3687 Encounter for antenatal screening for uncertain dates: Secondary | ICD-10-CM | POA: Insufficient documentation

## 2023-05-01 DIAGNOSIS — Z3A08 8 weeks gestation of pregnancy: Secondary | ICD-10-CM | POA: Insufficient documentation

## 2023-05-01 DIAGNOSIS — O3680X0 Pregnancy with inconclusive fetal viability, not applicable or unspecified: Secondary | ICD-10-CM | POA: Insufficient documentation

## 2023-05-01 DIAGNOSIS — O3411 Maternal care for benign tumor of corpus uteri, first trimester: Secondary | ICD-10-CM | POA: Diagnosis not present

## 2023-05-01 DIAGNOSIS — O208 Other hemorrhage in early pregnancy: Secondary | ICD-10-CM | POA: Insufficient documentation

## 2023-05-03 NOTE — L&D Delivery Note (Signed)
 OB/GYN Faculty Practice Delivery Note  Matalie Bint Almetta Liddicoat is a 28 y.o. G2P1001 s/p SVD at [redacted]w[redacted]d. She was admitted for IOL due to A1GDM and LGA.   ROM: 8h 49m with clear fluid GBS Status: Positive/-- (07/18 1146), treated with PCN Maximum Maternal Temperature: 98.67F   Labor Progress: Initial SVE: closed/thick/high. She then progressed to complete.   Delivery Date/Time: 11/21/2023 1529 Delivery: Called to room and patient was complete and pushing. Head delivered ROA. No nuchal cord present. Shoulder and body delivered in usual fashion. Infant with spontaneous cry, placed on mother's abdomen, dried and stimulated. Cord clamped x 2 after 1-minute delay, and cut by father of baby, Jihad. Cord blood drawn. Placenta delivered spontaneously with gentle cord traction. Fundus firm with massage and Pitocin . Labia, perineum, vagina, and cervix inspected inspected with no lacerations noted.  Baby Weight: pending  Placenta: Patient planning to take home Complications: None Lacerations: None EBL: 300 mL Analgesia: Epidural   Infant:  APGAR (1 MIN): 7  APGAR (5 MINS): 9   Alain Sor, MD OB Fellow, Faculty Practice [facility], Center for [facility]

## 2023-05-04 ENCOUNTER — Encounter: Payer: Self-pay | Admitting: Obstetrics and Gynecology

## 2023-05-16 ENCOUNTER — Encounter: Payer: Self-pay | Admitting: Advanced Practice Midwife

## 2023-05-18 ENCOUNTER — Other Ambulatory Visit: Payer: Self-pay

## 2023-05-18 ENCOUNTER — Ambulatory Visit (INDEPENDENT_AMBULATORY_CARE_PROVIDER_SITE_OTHER): Payer: 59 | Admitting: Obstetrics and Gynecology

## 2023-05-18 ENCOUNTER — Encounter: Payer: Self-pay | Admitting: Obstetrics and Gynecology

## 2023-05-18 ENCOUNTER — Telehealth: Payer: Self-pay | Admitting: Family Medicine

## 2023-05-18 VITALS — BP 132/75 | HR 98 | Wt 211.5 lb

## 2023-05-18 DIAGNOSIS — Z3481 Encounter for supervision of other normal pregnancy, first trimester: Secondary | ICD-10-CM

## 2023-05-18 DIAGNOSIS — Z8632 Personal history of gestational diabetes: Secondary | ICD-10-CM

## 2023-05-18 DIAGNOSIS — Z3A11 11 weeks gestation of pregnancy: Secondary | ICD-10-CM

## 2023-05-18 DIAGNOSIS — Z3480 Encounter for supervision of other normal pregnancy, unspecified trimester: Secondary | ICD-10-CM

## 2023-05-18 NOTE — Progress Notes (Signed)
Urine was not found to draw GC/Chlamydia swab from, will need to get at next visit.

## 2023-05-18 NOTE — Progress Notes (Signed)
INITIAL PRENATAL VISIT NOTE  Subjective:  Sally Shannon is a 28 y.o. G2P1001 at [redacted]w[redacted]d by early ultrasound being seen today for her initial prenatal visit. She has an obstetric history significant for SVD x 1 and gestational diabetes. She has a medical history significant for anemia and vitamin d deficiency.  Patient reports no complaints.   . Vag. Bleeding: None.  Movement: Absent. Denies leaking of fluid.    Past Medical History:  Diagnosis Date   Anemia    HLD (hyperlipidemia)    Vitamin D deficiency     Past Surgical History:  Procedure Laterality Date   WISDOM TOOTH EXTRACTION      OB History  Gravida Para Term Preterm AB Living  2 1 1   1   SAB IAB Ectopic Multiple Live Births     0 1    # Outcome Date GA Lbr Len/2nd Weight Sex Type Anes PTL Lv  2 Current           1 Term 05/19/16 [redacted]w[redacted]d 12:26 / 00:25 7 lb 5.1 oz (3.32 kg) F Vag-Spont EPI  LIV     Birth Comments: WDL    Social History   Socioeconomic History   Marital status: Single    Spouse name: Not on file   Number of children: Not on file   Years of education: Not on file   Highest education level: Associate degree: occupational, Scientist, product/process development, or vocational program  Occupational History   Not on file  Tobacco Use   Smoking status: Never   Smokeless tobacco: Never  Substance and Sexual Activity   Alcohol use: No    Alcohol/week: 0.0 standard drinks of alcohol   Drug use: No   Sexual activity: Yes    Birth control/protection: None  Other Topics Concern   Not on file  Social History Narrative   Not on file   Social Drivers of Health   Financial Resource Strain: Low Risk  (03/15/2023)   Overall Financial Resource Strain (CARDIA)    Difficulty of Paying Living Expenses: Not hard at all  Food Insecurity: No Food Insecurity (03/15/2023)   Hunger Vital Sign    Worried About Running Out of Food in the Last Year: Never true    Ran Out of Food in the Last Year: Never true  Transportation Needs: No  Transportation Needs (03/15/2023)   PRAPARE - Administrator, Civil Service (Medical): No    Lack of Transportation (Non-Medical): No  Physical Activity: Sufficiently Active (03/15/2023)   Exercise Vital Sign    Days of Exercise per Week: 5 days    Minutes of Exercise per Session: 30 min  Stress: No Stress Concern Present (03/15/2023)   [employer] of Occupational Health - Occupational Stress Questionnaire    Feeling of Stress : Only a little  Social Connections: Moderately Integrated (03/15/2023)   Social Connection and Isolation Panel [NHANES]    Frequency of Communication with Friends and Family: More than three times a week    Frequency of Social Gatherings with Friends and Family: Twice a week    Attends Religious Services: 1 to 4 times per year    Active Member of [employer] or Organizations: No    Attends Engineer, structural: Not on file    Marital Status: Married    Family History  Problem Relation Age of Onset   Anemia Mother    Depression Mother    Diabetes Mother    Anemia Maternal Grandmother  Current Outpatient Medications:    Prenatal Vit-Fe Fumarate-FA (PRENATAL MULTIVITAMIN) TABS tablet, Take 1 tablet by mouth daily at 12 noon., Disp: , Rfl:    ondansetron (ZOFRAN ODT) 4 MG disintegrating tablet, Take 1 tablet (4 mg total) by mouth every 8 (eight) hours as needed for nausea or vomiting. (Patient not taking: Reported on 03/15/2023), Disp: 10 tablet, Rfl: 0  No Known Allergies  Review of Systems: Negative except for what is mentioned in HPI.  Objective:   Vitals:   05/18/23 1518  BP: 132/75  Pulse: 98  Weight: 211 lb 8 oz (95.9 kg)    Fetal Status: Fetal Heart Rate (bpm): 158   Movement: Absent     Physical Exam: BP 132/75   Pulse 98   Wt 211 lb 8 oz (95.9 kg)   LMP 02/05/2023   BMI 36.30 kg/m  CONSTITUTIONAL: Well-developed, well-nourished female in no acute distress.  NEUROLOGIC: Alert and oriented to person, place,  and time. Normal reflexes, muscle tone coordination. No cranial nerve deficit noted. PSYCHIATRIC: Normal mood and affect. Normal behavior. Normal judgment and thought content. SKIN: Skin is warm and dry. No rash noted. Not diaphoretic. No erythema. No pallor. HENT:  Normocephalic, atraumatic, External right and left ear normal. Oropharynx is clear and moist EYES: Conjunctivae and EOM are normal.  NECK: Normal range of motion, supple, no masses CARDIOVASCULAR: Normal heart rate noted, regular rhythm RESPIRATORY: Effort and breath sounds normal, no problems with respiration noted BREASTS: deferred ABDOMEN: Soft, nontender, nondistended, gravid. GU: deferred MUSCULOSKELETAL: Normal range of motion. EXT:  No edema and no tenderness. 2+ distal pulses.   Assessment and Plan:  Pregnancy: G2P1001 at [redacted]w[redacted]d by early ultrasound  1. Supervision of other normal pregnancy, antepartum (Primary) Continue routine prenatal care - Culture, OB Urine - GC/Chlamydia probe amp ([place])not at [facility] - CBC/D/Plt+RPR+Rh+ABO+RubIgG... - Hemoglobin A1c - PANORAMA PRENATAL TEST - [place] Basic Panel - [facility] AMB BABYSCRIPTS SCHEDULE OPTIMIZATION  2. [redacted] weeks gestation of pregnancy   3. History of gestational diabetes Will check A1c; however pt admitted to chewing gum before the last GTT   Preterm labor symptoms and general obstetric precautions including but not limited to vaginal bleeding, contractions, leaking of fluid and fetal movement were reviewed in detail with the patient.  Please refer to After Visit Summary for other counseling recommendations.   Return in about 4 weeks (around 06/15/2023) for ROB, in person.  Warden Fillers 05/18/2023 3:45 PM

## 2023-05-18 NOTE — Telephone Encounter (Signed)
Patient will like for centering appointments to be canceled, it does not work with her work schedule.

## 2023-05-18 NOTE — Addendum Note (Signed)
Addended by: Henrietta Dine on: 05/18/2023 05:08 PM   Modules accepted: Orders

## 2023-05-19 LAB — CBC/D/PLT+RPR+RH+ABO+RUBIGG...
Antibody Screen: NEGATIVE
Basophils Absolute: 0 10*3/uL (ref 0.0–0.2)
Basos: 1 %
EOS (ABSOLUTE): 0.1 10*3/uL (ref 0.0–0.4)
Eos: 1 %
HCV Ab: NONREACTIVE
HIV Screen 4th Generation wRfx: NONREACTIVE
Hematocrit: 42.6 % (ref 34.0–46.6)
Hemoglobin: 13.9 g/dL (ref 11.1–15.9)
Hepatitis B Surface Ag: NEGATIVE
Immature Grans (Abs): 0 10*3/uL (ref 0.0–0.1)
Immature Granulocytes: 0 %
Lymphocytes Absolute: 1.9 10*3/uL (ref 0.7–3.1)
Lymphs: 23 %
MCH: 27.4 pg (ref 26.6–33.0)
MCHC: 32.6 g/dL (ref 31.5–35.7)
MCV: 84 fL (ref 79–97)
Monocytes Absolute: 0.4 10*3/uL (ref 0.1–0.9)
Monocytes: 5 %
Neutrophils Absolute: 5.8 10*3/uL (ref 1.4–7.0)
Neutrophils: 70 %
Platelets: 365 10*3/uL (ref 150–450)
RBC: 5.08 x10E6/uL (ref 3.77–5.28)
RDW: 14.2 % (ref 11.7–15.4)
RPR Ser Ql: NONREACTIVE
Rh Factor: POSITIVE
Rubella Antibodies, IGG: 23.2 {index} (ref >0.99)
WBC: 8.2 10*3/uL (ref 3.4–10.8)

## 2023-05-19 LAB — HCV INTERPRETATION

## 2023-05-19 LAB — HEMOGLOBIN A1C
Est. average glucose Bld gHb Est-mCnc: 105 mg/dL
Hgb A1c MFr Bld: 5.3 % (ref 4.8–5.6)

## 2023-05-20 LAB — URINE CULTURE, OB REFLEX

## 2023-05-20 LAB — CULTURE, OB URINE

## 2023-07-05 ENCOUNTER — Other Ambulatory Visit (HOSPITAL_COMMUNITY)
Admission: RE | Admit: 2023-07-05 | Discharge: 2023-07-05 | Disposition: A | Discharge status: Home / Self Care | Source: Ambulatory Visit | Attending: Family Medicine | Admitting: Family Medicine

## 2023-07-05 ENCOUNTER — Ambulatory Visit: Admitting: *Deleted

## 2023-07-05 ENCOUNTER — Other Ambulatory Visit: Payer: Self-pay

## 2023-07-05 VITALS — BP 119/76 | HR 90 | Ht 64.0 in | Wt 212.8 lb

## 2023-07-05 DIAGNOSIS — O26899 Other specified pregnancy related conditions, unspecified trimester: Secondary | ICD-10-CM

## 2023-07-05 DIAGNOSIS — Z3480 Encounter for supervision of other normal pregnancy, unspecified trimester: Secondary | ICD-10-CM

## 2023-07-05 DIAGNOSIS — N898 Other specified noninflammatory disorders of vagina: Secondary | ICD-10-CM | POA: Insufficient documentation

## 2023-07-05 MED ORDER — FAMOTIDINE 20 MG PO TABS
20.0000 mg | ORAL_TABLET | Freq: Two times a day (BID) | ORAL | 4 refills | Status: DC
Start: 2023-07-05 — End: 2023-10-19

## 2023-07-05 MED ORDER — PRENATAL VITAMIN 27-0.8 MG PO TABS: 1.0000 | ORAL_TABLET | Freq: Every day | ORAL | 12 refills | Status: DC

## 2023-07-05 NOTE — Progress Notes (Signed)
 Pt presents with c/o vaginal irritation after intercourse and itching for 5-6 days. She also has yellow discharge.  Self swab was obtained and pt advised she will be notified of results and treatment via Mychart. Pt stated that she prefers to have Diflucan if result is positive for yeast because she has used the cream in the past with poor results.

## 2023-07-06 LAB — CERVICOVAGINAL ANCILLARY ONLY
Bacterial Vaginitis (gardnerella): NEGATIVE
Candida Glabrata: NEGATIVE
Candida Vaginitis: POSITIVE — AB
Comment: NEGATIVE
Comment: NEGATIVE
Comment: NEGATIVE

## 2023-07-07 ENCOUNTER — Encounter: Payer: Self-pay | Admitting: Family Medicine

## 2023-07-07 ENCOUNTER — Other Ambulatory Visit: Payer: Self-pay | Admitting: Family Medicine

## 2023-07-07 MED ORDER — CLOTRIMAZOLE 1 % EX CREA: 1.0000 | TOPICAL_CREAM | Freq: Two times a day (BID) | CUTANEOUS | 0 refills | Status: DC

## 2023-07-20 ENCOUNTER — Ambulatory Visit: Admitting: *Deleted

## 2023-07-20 ENCOUNTER — Other Ambulatory Visit: Payer: Self-pay | Admitting: *Deleted

## 2023-07-20 ENCOUNTER — Ambulatory Visit: Admitting: Obstetrics

## 2023-07-20 ENCOUNTER — Other Ambulatory Visit: Payer: Self-pay

## 2023-07-20 ENCOUNTER — Other Ambulatory Visit (HOSPITAL_COMMUNITY)
Admission: RE | Admit: 2023-07-20 | Discharge: 2023-07-20 | Disposition: A | Discharge status: Home / Self Care | Source: Ambulatory Visit | Attending: Obstetrics and Gynecology | Admitting: Obstetrics and Gynecology

## 2023-07-20 ENCOUNTER — Ambulatory Visit: Payer: 59 | Admitting: Obstetrics and Gynecology

## 2023-07-20 ENCOUNTER — Ambulatory Visit

## 2023-07-20 VITALS — BP 107/74 | HR 89 | Wt 214.7 lb

## 2023-07-20 VITALS — BP 112/69 | HR 101

## 2023-07-20 DIAGNOSIS — Z3A20 20 weeks gestation of pregnancy: Secondary | ICD-10-CM | POA: Insufficient documentation

## 2023-07-20 DIAGNOSIS — Z8632 Personal history of gestational diabetes: Secondary | ICD-10-CM

## 2023-07-20 DIAGNOSIS — D259 Leiomyoma of uterus, unspecified: Secondary | ICD-10-CM | POA: Insufficient documentation

## 2023-07-20 DIAGNOSIS — O99213 Obesity complicating pregnancy, third trimester: Secondary | ICD-10-CM | POA: Diagnosis not present

## 2023-07-20 DIAGNOSIS — O3412 Maternal care for benign tumor of corpus uteri, second trimester: Secondary | ICD-10-CM | POA: Insufficient documentation

## 2023-07-20 DIAGNOSIS — Z3480 Encounter for supervision of other normal pregnancy, unspecified trimester: Secondary | ICD-10-CM

## 2023-07-20 DIAGNOSIS — E669 Obesity, unspecified: Secondary | ICD-10-CM

## 2023-07-20 DIAGNOSIS — Z3A19 19 weeks gestation of pregnancy: Secondary | ICD-10-CM

## 2023-07-20 DIAGNOSIS — O99212 Obesity complicating pregnancy, second trimester: Secondary | ICD-10-CM | POA: Insufficient documentation

## 2023-07-20 DIAGNOSIS — Z3482 Encounter for supervision of other normal pregnancy, second trimester: Secondary | ICD-10-CM | POA: Diagnosis not present

## 2023-07-20 DIAGNOSIS — Z0289 Encounter for other administrative examinations: Secondary | ICD-10-CM

## 2023-07-20 DIAGNOSIS — O09299 Supervision of pregnancy with other poor reproductive or obstetric history, unspecified trimester: Secondary | ICD-10-CM

## 2023-07-20 NOTE — Progress Notes (Signed)
 MFM Consult Note  Sally Shannon is currently at 20 weeks and 0 days.  Sally Shannon was seen due to maternal obesity with a BMI of 36 and a fibroid uterus.  Sally Shannon denies any significant past medical history and denies any problems in her current pregnancy.  Sally Shannon does report some pain near her umbilicus possibly related to her fibroids.  Sally Shannon had a cell free DNA test earlier in her pregnancy which indicated a low risk for trisomy 11, 60, and 13. A female fetus is predicted.   Sally Shannon was informed that the fetal growth and amniotic fluid level were appropriate for her gestational age.   The views of the fetal anatomy were limited today due to the fetal position (prone).  The fetal cardiac views and views of the abdominal cord insertion site and face and lips could not be fully visualized.  The patient was informed that anomalies may be missed due to technical limitations. If the fetus is in a suboptimal position or maternal habitus is increased, visualization of the fetus in the maternal uterus may be impaired.  Multiple fibroids were noted in her uterus today.    The increased risk of maternal pain issues and fetal growth issues later in her pregnancy due to the fibroids was discussed.    The patient was advised that some women may require pain medication during pregnancy for management of pain issues related to fibroids.    Due to the increased risk of IUGR and fibroids, we will continue to follow her with growth ultrasounds throughout her pregnancy.    A follow-up exam was scheduled in 4 weeks to assess the fetal growth and to complete the views of the fetal anatomy.    The patient stated that all of her questions were answered today.  A total of 30 minutes was spent counseling and coordinating the care for this patient.  Greater than 50% of the time was spent in direct face-to-face contact.

## 2023-07-20 NOTE — Progress Notes (Signed)
   PRENATAL VISIT NOTE  Subjective:  Sally Shannon is a 28 y.o. G2P1001 at [redacted]w[redacted]d being seen today for ongoing prenatal care.  She is currently monitored for the following issues for this low-risk pregnancy and has Right knee pain; Acquired trigger finger of left index finger; Carpal tunnel syndrome of right wrist; Sacroiliac joint pain; Cervical spine pain; Vitamin D deficiency; Anemia; HLD (hyperlipidemia); Supervision of other normal pregnancy, antepartum; and History of gestational diabetes on their problem list.  Patient doing well with no acute concerns today. She reports no complaints.  Contractions: Not present. Vag. Bleeding: None.  Movement: Present. Denies leaking of fluid.   The following portions of the patient's history were reviewed and updated as appropriate: allergies, current medications, past family history, past medical history, past social history, past surgical history and problem list. Problem list updated.  Objective:   Vitals:   07/20/23 1342  BP: 107/74  Pulse: 89  Weight: 214 lb 11.2 oz (97.4 kg)    Fetal Status: Fetal Heart Rate (bpm): 159 Fundal Height: 22 cm Movement: Present     General:  Alert, oriented and cooperative. Patient is in no acute distress.  Skin: Skin is warm and dry. No rash noted.   Cardiovascular: Normal heart rate noted  Respiratory: Normal respiratory effort, no problems with respiration noted  Abdomen: Soft, gravid, appropriate for gestational age.  Pain/Pressure: Present     Pelvic: Cervical exam deferred        Extremities: Normal range of motion.     Mental Status:  Normal mood and affect. Normal behavior. Normal judgment and thought content.   Assessment and Plan:  Pregnancy: G2P1001 at [redacted]w[redacted]d  1. Supervision of other normal pregnancy, antepartum (Primary) Continue routine prenatal care Pt to return in 8 weeks due to babyscript scheduling - AFP, Serum, Open Spina Bifida - GC/Chlamydia probe amp ([place])not at [facility]  2. [redacted]  weeks gestation of pregnancy   3. History of gestational diabetes 2 hour GTT at next visit  Preterm labor symptoms and general obstetric precautions including but not limited to vaginal bleeding, contractions, leaking of fluid and fetal movement were reviewed in detail with the patient.  Please refer to After Visit Summary for other counseling recommendations.   Return in about 8 weeks (around 09/14/2023) for ROB, in person, 3rd trim labs, 2 hr GTT.   Mariel Aloe, MD Faculty Attending Center for [facility]

## 2023-07-21 LAB — GC/CHLAMYDIA PROBE AMP (~~LOC~~) NOT AT ARMC
Chlamydia: NEGATIVE
Comment: NEGATIVE
Comment: NORMAL
Neisseria Gonorrhea: NEGATIVE

## 2023-07-22 LAB — AFP, SERUM, OPEN SPINA BIFIDA
AFP MoM: 0.91
AFP Value: 45.3 ng/mL
Gest. Age on Collection Date: 20 wk
Maternal Age At EDD: 26.6 a
OSBR Risk 1 IN: 10000
Test Results:: NEGATIVE
Weight: 215 [lb_av]

## 2023-08-17 ENCOUNTER — Ambulatory Visit (HOSPITAL_BASED_OUTPATIENT_CLINIC_OR_DEPARTMENT_OTHER): Admitting: Obstetrics and Gynecology

## 2023-08-17 ENCOUNTER — Ambulatory Visit: Attending: Obstetrics

## 2023-08-17 ENCOUNTER — Other Ambulatory Visit: Payer: Self-pay | Admitting: *Deleted

## 2023-08-17 DIAGNOSIS — Z3A23 23 weeks gestation of pregnancy: Secondary | ICD-10-CM | POA: Insufficient documentation

## 2023-08-17 DIAGNOSIS — O3412 Maternal care for benign tumor of corpus uteri, second trimester: Secondary | ICD-10-CM | POA: Diagnosis not present

## 2023-08-17 DIAGNOSIS — O09299 Supervision of pregnancy with other poor reproductive or obstetric history, unspecified trimester: Secondary | ICD-10-CM | POA: Insufficient documentation

## 2023-08-17 DIAGNOSIS — Z8632 Personal history of gestational diabetes: Secondary | ICD-10-CM | POA: Insufficient documentation

## 2023-08-17 DIAGNOSIS — D259 Leiomyoma of uterus, unspecified: Secondary | ICD-10-CM

## 2023-08-17 DIAGNOSIS — O99212 Obesity complicating pregnancy, second trimester: Secondary | ICD-10-CM | POA: Diagnosis present

## 2023-08-17 DIAGNOSIS — O09292 Supervision of pregnancy with other poor reproductive or obstetric history, second trimester: Secondary | ICD-10-CM

## 2023-08-17 DIAGNOSIS — E669 Obesity, unspecified: Secondary | ICD-10-CM

## 2023-08-17 NOTE — Progress Notes (Signed)
 After review, MFM consult with provider is not indicated for today  Cassandria Clever, MD 08/17/2023 4:54 PM  [facility]

## 2023-09-12 ENCOUNTER — Other Ambulatory Visit: Payer: Self-pay

## 2023-09-12 DIAGNOSIS — Z3A27 27 weeks gestation of pregnancy: Secondary | ICD-10-CM

## 2023-09-14 ENCOUNTER — Other Ambulatory Visit

## 2023-09-14 ENCOUNTER — Encounter: Payer: Self-pay | Admitting: Advanced Practice Midwife

## 2023-10-16 ENCOUNTER — Ambulatory Visit

## 2023-10-16 DIAGNOSIS — Z3480 Encounter for supervision of other normal pregnancy, unspecified trimester: Secondary | ICD-10-CM

## 2023-10-17 ENCOUNTER — Other Ambulatory Visit: Payer: Self-pay

## 2023-10-17 ENCOUNTER — Other Ambulatory Visit

## 2023-10-17 DIAGNOSIS — Z3A27 27 weeks gestation of pregnancy: Secondary | ICD-10-CM

## 2023-10-17 NOTE — Progress Notes (Unsigned)
 Patient seen in lab today for 2hr GTT. CNM called to lab due to patient unable to tolerate glucose load. She was diaphoretic, with significant pallor and reported feeling very ill, light headed.  VS were recorded at bedside by CMA - 64/49 though without tachycardia and pulse ox in normal range. She was provided water and peanut butter crackers. She reported feeling slightly better after several minutes and was sent to lobby with a friend to wait on her husband. Repeat BP WNL and patient reported feeling much better.   Recommend treating as GDM and performing fingersticks 4x daily for 2 weeks. Should send readings to MyChart.   Raford Bunk, MSN, CNM, RNC-OB Certified Nurse Midwife, [facility] 10/17/2023 5:19 PM

## 2023-10-18 LAB — CBC
Hematocrit: 34.9 % (ref 34.0–46.6)
Hemoglobin: 10.6 g/dL — ABNORMAL LOW (ref 11.1–15.9)
MCH: 24.4 pg — ABNORMAL LOW (ref 26.6–33.0)
MCHC: 30.4 g/dL — ABNORMAL LOW (ref 31.5–35.7)
MCV: 80 fL (ref 79–97)
Platelets: 384 10*3/uL (ref 150–450)
RBC: 4.35 x10E6/uL (ref 3.77–5.28)
RDW: 14 % (ref 11.7–15.4)
WBC: 7.4 10*3/uL (ref 3.4–10.8)

## 2023-10-18 LAB — RPR: RPR Ser Ql: NONREACTIVE

## 2023-10-18 LAB — HIV ANTIBODY (ROUTINE TESTING W REFLEX): HIV Screen 4th Generation wRfx: NONREACTIVE

## 2023-10-19 ENCOUNTER — Ambulatory Visit: Admitting: Nurse Practitioner

## 2023-10-19 ENCOUNTER — Encounter: Payer: Self-pay | Admitting: Nurse Practitioner

## 2023-10-19 ENCOUNTER — Other Ambulatory Visit: Payer: Self-pay

## 2023-10-19 VITALS — BP 113/76 | HR 114 | Wt 234.4 lb

## 2023-10-19 DIAGNOSIS — O99413 Diseases of the circulatory system complicating pregnancy, third trimester: Secondary | ICD-10-CM

## 2023-10-19 DIAGNOSIS — D259 Leiomyoma of uterus, unspecified: Secondary | ICD-10-CM

## 2023-10-19 DIAGNOSIS — B372 Candidiasis of skin and nail: Secondary | ICD-10-CM

## 2023-10-19 DIAGNOSIS — Z3A33 33 weeks gestation of pregnancy: Secondary | ICD-10-CM

## 2023-10-19 DIAGNOSIS — O99213 Obesity complicating pregnancy, third trimester: Secondary | ICD-10-CM | POA: Diagnosis not present

## 2023-10-19 DIAGNOSIS — O3413 Maternal care for benign tumor of corpus uteri, third trimester: Secondary | ICD-10-CM

## 2023-10-19 DIAGNOSIS — R12 Heartburn: Secondary | ICD-10-CM

## 2023-10-19 DIAGNOSIS — Z3493 Encounter for supervision of normal pregnancy, unspecified, third trimester: Secondary | ICD-10-CM

## 2023-10-19 DIAGNOSIS — Z8632 Personal history of gestational diabetes: Secondary | ICD-10-CM

## 2023-10-19 DIAGNOSIS — O26899 Other specified pregnancy related conditions, unspecified trimester: Secondary | ICD-10-CM

## 2023-10-19 DIAGNOSIS — O99713 Diseases of the skin and subcutaneous tissue complicating pregnancy, third trimester: Secondary | ICD-10-CM

## 2023-10-19 MED ORDER — ACCU-CHEK GUIDE W/DEVICE KIT: 1.0000 | PACK | Freq: Four times a day (QID) | 0 refills | Status: DC

## 2023-10-19 MED ORDER — NYSTATIN 100000 UNIT/GM EX POWD: 1.0000 | Freq: Three times a day (TID) | CUTANEOUS | 0 refills | Status: DC

## 2023-10-19 MED ORDER — FAMOTIDINE 20 MG PO TABS: 20.0000 mg | ORAL_TABLET | Freq: Two times a day (BID) | ORAL | 4 refills | Status: DC

## 2023-10-19 MED ORDER — GLUCOSE BLOOD VI STRP: ORAL_STRIP | 12 refills | Status: DC

## 2023-10-19 MED ORDER — ACCU-CHEK SOFTCLIX LANCETS MISC: 12 refills | Status: DC

## 2023-10-19 NOTE — Progress Notes (Signed)
   PRENATAL VISIT NOTE  Subjective:  Sally Shannon is a 28 y.o. G2P1001 at [redacted]w[redacted]d being seen today for ongoing prenatal care.  She is currently monitored for the following issues for this low-risk pregnancy and has Right knee pain; Acquired trigger finger of left index finger; Carpal tunnel syndrome of right wrist; Sacroiliac joint pain; Cervical spine pain; Vitamin D deficiency; Anemia; HLD (hyperlipidemia); Supervision of other normal pregnancy, antepartum; and History of gestational diabetes on their problem list.  Patient reports heartburn and irritation  between her thighs, itching and chafing.  Contractions: Irritability. Vag. Bleeding: None.  Movement: Present. Denies leaking of fluid.    The following portions of the patient's history were reviewed and updated as appropriate: allergies, current medications, past family history, past medical history, past social history, past surgical history and problem list.   Objective:   Vitals:   10/19/23 1132  BP: 113/76  Pulse: (!) 114  Weight: 234 lb 6.4 oz (106.3 kg)    Fetal Status: Fetal Heart Rate (bpm): 152 Fundal Height: 33 cm Movement: Present     General:  Alert, oriented and cooperative. Patient is in no acute distress.  Skin: Skin is warm and dry. No rash noted. B/L chafing in the inner thighs likely yeast dermatitis   Cardiovascular: Normal heart rate noted  Respiratory: Normal respiratory effort, no problems with respiration noted  Abdomen: Soft, gravid, appropriate for gestational age.  Pain/Pressure: Present     Pelvic: Cervical exam deferred        Extremities: Normal range of motion.  Edema: Trace  Mental Status: Normal mood and affect. Normal behavior. Normal judgment and thought content.   Assessment and Plan:  Pregnancy: G2P1001 at [redacted]w[redacted]d  1. Heartburn during pregnancy, antepartum - Rx for Pepcid  sent   2. Yeast dermatitis - RX for Nystatin  Powder sent   3. [redacted] weeks gestation of pregnancy - FH appropriate - FHR  appropriate   4. History of gestational diabetes - Patient unable to tolerate GTT - H/O GDM with prior pregnancies - Will be doing finger sticks at home - RX's sent for supplies to pharmacy  - Blood sugar log provided   5. Encounter for supervision of low-risk pregnancy in third trimester   6. Obesity, morbid (more than 100 lbs over ideal weight or BMI > 40) (HCC)   7. 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 Please refer to After Visit Summary for other counseling recommendations.   Return in about 2 weeks (around 11/02/2023) for LOB + GBS.  Future Appointments  Date Time Provider Department Center  10/26/2023  3:00 PM [facility] PROVIDER 1 [facility]  10/26/2023  3:30 PM [facility] US5 [facility]    Cherlynn Cornfield, NP

## 2023-10-26 ENCOUNTER — Ambulatory Visit: Attending: Obstetrics and Gynecology | Admitting: Obstetrics

## 2023-10-26 ENCOUNTER — Ambulatory Visit

## 2023-10-26 ENCOUNTER — Other Ambulatory Visit: Payer: Self-pay | Admitting: *Deleted

## 2023-10-26 DIAGNOSIS — O409XX0 Polyhydramnios, unspecified trimester, not applicable or unspecified: Secondary | ICD-10-CM

## 2023-10-26 DIAGNOSIS — O3663X0 Maternal care for excessive fetal growth, third trimester, not applicable or unspecified: Secondary | ICD-10-CM | POA: Diagnosis not present

## 2023-10-26 DIAGNOSIS — Z3A33 33 weeks gestation of pregnancy: Secondary | ICD-10-CM | POA: Insufficient documentation

## 2023-10-26 DIAGNOSIS — D259 Leiomyoma of uterus, unspecified: Secondary | ICD-10-CM | POA: Insufficient documentation

## 2023-10-26 DIAGNOSIS — Z3A34 34 weeks gestation of pregnancy: Secondary | ICD-10-CM

## 2023-10-26 DIAGNOSIS — E669 Obesity, unspecified: Secondary | ICD-10-CM | POA: Diagnosis not present

## 2023-10-26 DIAGNOSIS — O3413 Maternal care for benign tumor of corpus uteri, third trimester: Secondary | ICD-10-CM | POA: Diagnosis not present

## 2023-10-26 DIAGNOSIS — O99212 Obesity complicating pregnancy, second trimester: Secondary | ICD-10-CM

## 2023-10-26 DIAGNOSIS — O24410 Gestational diabetes mellitus in pregnancy, diet controlled: Secondary | ICD-10-CM | POA: Diagnosis not present

## 2023-10-26 DIAGNOSIS — O3412 Maternal care for benign tumor of corpus uteri, second trimester: Secondary | ICD-10-CM | POA: Diagnosis not present

## 2023-10-26 DIAGNOSIS — O3660X0 Maternal care for excessive fetal growth, unspecified trimester, not applicable or unspecified: Secondary | ICD-10-CM

## 2023-10-26 DIAGNOSIS — O99213 Obesity complicating pregnancy, third trimester: Secondary | ICD-10-CM | POA: Insufficient documentation

## 2023-10-26 DIAGNOSIS — O99210 Obesity complicating pregnancy, unspecified trimester: Secondary | ICD-10-CM

## 2023-10-26 DIAGNOSIS — O403XX1 Polyhydramnios, third trimester, fetus 1: Secondary | ICD-10-CM | POA: Insufficient documentation

## 2023-10-26 NOTE — Progress Notes (Signed)
 MFM Consult Note  Sally Shannon is currently at 34 weeks and 0 days.  She has been followed due to maternal obesity with a BMI of 36.9.    The patient reports that her screening test for gestational diabetes could not be completed as she fainted after drinking the glucose drink.  She is monitoring her fingersticks 4 times a day.    I reviewed her fingerstick logs today.  The majority of her fingerstick values are within the normal range.  On today's exam, the overall EFW of 6 pounds 4 ounces measures large for her gestational age (95th percentile).    Borderline polyhydramnios with a total AFI of 23 cm with a maximal vertical pocket of 9 cm was noted today.  The patient was advised that due to the large for gestational age fetus and polyhydramnios noted today, she may have gestational diabetes.    She was advised to continue to monitor her fingersticks 4 times a day.    Should the majority of her fingerstick values (greater than 50%) being greater than the stated range, she may to be started on metformin or insulin for treatment.    Weekly fetal testing is recommended should she is require insulin or metformin for treatment.    A follow-up growth scan was scheduled in 4 weeks.    Should a large for gestational age fetus be noted at that time, delivery will be recommended at around 39 weeks.    The patient stated that all of her questions were answered today.  A total of 20 minutes was spent counseling and coordinating the care for this patient.  Greater than 50% of the time was spent in direct face-to-face contact.

## 2023-10-31 ENCOUNTER — Ambulatory Visit: Admitting: Advanced Practice Midwife

## 2023-10-31 ENCOUNTER — Other Ambulatory Visit: Payer: Self-pay

## 2023-10-31 VITALS — BP 99/70 | HR 105 | Wt 234.2 lb

## 2023-10-31 DIAGNOSIS — Z3480 Encounter for supervision of other normal pregnancy, unspecified trimester: Secondary | ICD-10-CM

## 2023-10-31 DIAGNOSIS — O99613 Diseases of the digestive system complicating pregnancy, third trimester: Secondary | ICD-10-CM

## 2023-10-31 DIAGNOSIS — R1013 Epigastric pain: Secondary | ICD-10-CM

## 2023-10-31 DIAGNOSIS — M5432 Sciatica, left side: Secondary | ICD-10-CM

## 2023-10-31 DIAGNOSIS — O24410 Gestational diabetes mellitus in pregnancy, diet controlled: Secondary | ICD-10-CM

## 2023-10-31 DIAGNOSIS — O26893 Other specified pregnancy related conditions, third trimester: Secondary | ICD-10-CM

## 2023-10-31 DIAGNOSIS — M5431 Sciatica, right side: Secondary | ICD-10-CM | POA: Diagnosis not present

## 2023-10-31 DIAGNOSIS — Z8632 Personal history of gestational diabetes: Secondary | ICD-10-CM | POA: Insufficient documentation

## 2023-10-31 DIAGNOSIS — Z3A34 34 weeks gestation of pregnancy: Secondary | ICD-10-CM

## 2023-10-31 NOTE — Progress Notes (Signed)
   PRENATAL VISIT NOTE  Subjective:  Sally Shannon is a 28 y.o. G2P1001 at [redacted]w[redacted]d being seen today for ongoing prenatal care.  She is currently monitored for the following issues for this high-risk pregnancy and has Right knee pain; Acquired trigger finger of left index finger; Carpal tunnel syndrome of right wrist; Sacroiliac joint pain; Cervical spine pain; Vitamin D deficiency; Anemia; HLD (hyperlipidemia); Supervision of other normal pregnancy, antepartum; and History of gestational diabetes on their problem list.   Patient reports epigastric pain that doesn't feel like heartburn. Suspects fibroid. .Report bilat LBP radiating into hips that interferes w/ sleep. Not associated w/ UC's.  Contractions: Irritability. Vag. Bleeding: None.  Movement: Present. Denies leaking of fluid.   The following portions of the patient's history were reviewed and updated as appropriate: allergies, current medications, past family history, past medical history, past social history, past surgical history and problem list.   Objective:   Vitals:   10/31/23 0846  BP: 99/70  Pulse: (!) 105  Weight: 234 lb 3.2 oz (106.2 kg)    Fetal Status: Fetal Heart Rate (bpm): 150   Movement: Present     General:  Alert, oriented and cooperative. Patient is in no acute distress.  Skin: Skin is warm and dry. No rash noted.   Cardiovascular: Normal heart rate noted  Respiratory: Normal respiratory effort, no problems with respiration noted  Abdomen: Soft, gravid, appropriate for gestational age.  Pain/Pressure: Present     Pelvic: Cervical exam deferred        Extremities: Normal range of motion.  Edema: Trace  Mental Status: Normal mood and affect. Normal behavior. Normal judgment and thought content.   Est. FW: 2835 gm 6 lb 4 oz 95 %. AFI 88%   Assessment and Plan:  Pregnancy: G2P1001 at [redacted]w[redacted]d 1. Supervision of other normal pregnancy, antepartum (Primary)  2. [redacted] weeks gestation of pregnancy - Ambulatory referral to  Physical Therapy  3. Diet controlled gestational diabetes mellitus (GDM) in third trimester - CBGs mostly in-range. Much better with dietary changes, Not all morning CBGs are truly fasting due to eating late after second shift. Instructe dto indicated those on log.  - PLan 39 week IOL 4. Bilateral sciatica - Ambulatory referral to Physical Therapy  5. Epigastric pain  Preterm labor symptoms and general obstetric precautions including but not limited to vaginal bleeding, contractions, leaking of fluid and fetal movement were reviewed in detail with the patient. Please refer to After Visit Summary for other counseling recommendations.   No follow-ups on file.  Future Appointments  Date Time Provider Department Center  11/17/2023 10:15 AM [name] HERO, MD [facility]  11/20/2023  4:15 PM [name], PT [facility] None  11/23/2023  2:45 PM [facility] PROVIDER 1 [facility]  11/23/2023  3:00 PM [facility] US5 [facility]    [name]  [name] [facility] for [employer]

## 2023-11-01 ENCOUNTER — Ambulatory Visit: Attending: Advanced Practice Midwife

## 2023-11-01 ENCOUNTER — Other Ambulatory Visit: Payer: Self-pay

## 2023-11-01 DIAGNOSIS — R262 Difficulty in walking, not elsewhere classified: Secondary | ICD-10-CM | POA: Diagnosis present

## 2023-11-01 DIAGNOSIS — M5432 Sciatica, left side: Secondary | ICD-10-CM | POA: Diagnosis not present

## 2023-11-01 DIAGNOSIS — M6281 Muscle weakness (generalized): Secondary | ICD-10-CM | POA: Insufficient documentation

## 2023-11-01 DIAGNOSIS — Z3A34 34 weeks gestation of pregnancy: Secondary | ICD-10-CM | POA: Diagnosis not present

## 2023-11-01 DIAGNOSIS — M5459 Other low back pain: Secondary | ICD-10-CM | POA: Insufficient documentation

## 2023-11-01 DIAGNOSIS — R252 Cramp and spasm: Secondary | ICD-10-CM | POA: Diagnosis present

## 2023-11-01 DIAGNOSIS — R293 Abnormal posture: Secondary | ICD-10-CM | POA: Diagnosis present

## 2023-11-01 DIAGNOSIS — M5431 Sciatica, right side: Secondary | ICD-10-CM | POA: Diagnosis not present

## 2023-11-01 NOTE — Therapy (Signed)
 OUTPATIENT PHYSICAL THERAPY THORACOLUMBAR EVALUATION   Patient Name: Sally Shannon MRN: [id number] DOB:[birth year], 28 y.o., female Today's Date: 11/01/2023  END OF SESSION:  PT End of Session - 11/01/23 1626     Visit Number 1    Date for PT Re-Evaluation 12/27/23    Authorization Type [employer]    Progress Note Due on Visit 10    PT Start Time 1620    PT Stop Time 1650    PT Time Calculation (min) 30 min    Activity Tolerance Patient tolerated treatment well;Patient limited by pain    Behavior During Therapy [facility] for tasks assessed/performed          Past Medical History:  Diagnosis Date   Anemia    HLD (hyperlipidemia)    Vitamin D deficiency    Past Surgical History:  Procedure Laterality Date   WISDOM TOOTH EXTRACTION     Patient Active Problem List   Diagnosis Date Noted   Gestational diabetes, diet controlled 10/31/2023   History of gestational diabetes 05/18/2023   Supervision of other normal pregnancy, antepartum 04/19/2023   Vitamin D deficiency    Anemia    HLD (hyperlipidemia)    Carpal tunnel syndrome of right wrist 12/06/2021   Acquired trigger finger of left index finger 01/14/2021   Sacroiliac joint pain 06/26/2019   Cervical spine pain 06/26/2019   Right knee pain 06/21/2017    PCP: Norleen Lynwood ORN, MD  REFERRING PROVIDER: Claudene, [place] , CNM  REFERRING DIAG: Z3A.34 (ICD-10-CM) - [redacted] weeks gestation of pregnancy M54.31,M54.32 (ICD-10-CM) - Bilateral sciatica  Rationale for Evaluation and Treatment: Rehabilitation  THERAPY DIAG:  Other low back pain  Muscle weakness (generalized)  Difficulty in walking, not elsewhere classified  Cramp and spasm  Abnormal posture  ONSET DATE: 10/31/2023  SUBJECTIVE:                                                                                                                                                                                           SUBJECTIVE STATEMENT: Patient is 30+ weeks gestation.  She  reports pain in both hips and low back.  No radicular pain.  She has difficulty sleeping and start up after prolonged sitting.  She works for [employer] but is off for [employer].  She has a 27 year old.  She enjoys shopping but has not been able to do this due to the pain.  She hopes to find some exercises to do that at least alleviate her pain and allow her to stay active until the baby is born.    PERTINENT HISTORY:  na  PAIN:  Are  you having pain? Yes: NPRS scale: 4/10 Pain location: back and hips Pain description: aching, sharp Aggravating factors: bed mobility, sit to stand, standing Relieving factors: rest, stretching, hot shower  PRECAUTIONS: Other: pregnancy  RED FLAGS: None   WEIGHT BEARING RESTRICTIONS: No  FALLS:  Has patient fallen in last 6 months? No  LIVING ENVIRONMENT: Lives with: lives with their family Lives in: House/apartment   OCCUPATION: works for [employer]  PLOF: Independent, Independent with basic ADLs, Independent with household mobility without device, Independent with community mobility with device, Independent with homemaking with ambulation, Independent with gait, and Independent with transfers  PATIENT GOALS: to alleviate the pain  NEXT MD VISIT: weekly  OBJECTIVE:  Note: Objective measures were completed at Evaluation unless otherwise noted.  DIAGNOSTIC FINDINGS:  na  PATIENT SURVEYS:  Modified Oswestry:  MODIFIED OSWESTRY DISABILITY SCALE  Date: 11/01/23 Score  Pain intensity 5 =  Pain medication has no effect on my pain.  2. Personal care (washing, dressing, etc.) 2 =  It is painful to take care of myself, and I am slow and careful.  3. Lifting 5 =  I cannot lift or carry anything at all.  4. Walking 3 =  Pain prevents me from walking more than  mile.  5. Sitting 2 =  Pain prevents me from sitting more than 1 hour.  6. Standing 4 =  Pain prevents me from standing more than 10 minutes.  7. Sleeping 3 =  Even  when I take pain medication, I sleep less than 4 hours.  8. Social Life 5 =  I have hardly any social life because of my pain.  9. Traveling 4 = My pain restricts my travel to short necessary journeys under 1/2 hour.  10. Employment/ Homemaking 4 = Pain prevents me from doing even light duties.  Total 37/50   Interpretation of scores: Score Category Description  0-20% Minimal Disability The patient can cope with most living activities. Usually no treatment is indicated apart from advice on lifting, sitting and exercise  21-40% Moderate Disability The patient experiences more pain and difficulty with sitting, lifting and standing. Travel and social life are more difficult and they may be disabled from work. Personal care, sexual activity and sleeping are not grossly affected, and the patient can usually be managed by conservative means  41-60% Severe Disability Pain remains the main problem in this group, but activities of daily living are affected. These patients require a detailed investigation  61-80% Crippled Back pain impinges on all aspects of the patient's life. Positive intervention is required  81-100% Bed-bound  These patients are either bed-bound or exaggerating their symptoms  Bluford FORBES Zoe DELENA Karon DELENA, et al. Surgery versus conservative management of stable thoracolumbar fracture: the PRESTO feasibility RCT. [place]): [employer]; 2021 Nov. [facility] Technology Assessment, No. 25.62.) Appendix 3, Oswestry Disability Index category descriptors. Available from: FindJewelers.cz  Minimally Clinically Important Difference (MCID) = 12.8%  COGNITION: Overall cognitive status: Within functional limits for tasks assessed     SENSATION: WFL  MUSCLE LENGTH: Hamstrings: Right 55 deg; Left 50 deg Thomas test: Right pos Left pos  POSTURE: increased lumbar lordosis and anterior pelvic tilt  PALPATION: na  LUMBAR ROM:   All WNL with  exception of flexion : fingertips to knees  LOWER EXTREMITY ROM:     WNL  LOWER EXTREMITY MMT:    Generally 4 to 4+/5  LUMBAR SPECIAL TESTS:  Straight leg raise test: Negative  FUNCTIONAL  TESTS:  5 times sit to stand: complete next visit Timed up and go (TUG): complete next visit  GAIT: Distance walked: 50 feet Assistive device utilized: None Level of assistance: Complete Independence Comments: antalgic  TREATMENT DATE: 11/01/23 Initial eval completed and initiated HEP Educated on various back issues and what her symptoms indicate                                                                                                                                 PATIENT EDUCATION:  Education details: Initiated HEP and Educated on various back issues and what her symptoms indicate Person educated: Patient Education method: Programmer, multimedia, Facilities manager, Verbal cues, and Handouts Education comprehension: verbalized understanding, returned demonstration, and verbal cues required  HOME EXERCISE PROGRAM: Access Code: [email or web address] URL: [email or web address] Date: 11/01/2023 Prepared by: Delon Haddock  Exercises - Standing Hamstring Stretch on Chair  - 1 x daily - 7 x weekly - 1 sets - 3 reps - 30 sec hold - Quadricep Stretch with Chair and Counter Support  - 1 x daily - 7 x weekly - 1 sets - 3 reps - 30 sec hold - Seated Figure 4 Piriformis Stretch  - 1 x daily - 7 x weekly - 1 sets - 3 reps - 30 sed hold  ASSESSMENT:  CLINICAL IMPRESSION: Patient is a 28 y.o. female who was seen today for physical therapy evaluation and treatment for low back and hip pain during pregnancy.  She presents with decreased lumbar flexion and limited LE flexibility.  She also has poor core strength.  She would benefit from LE stretching, core strengthening and pain control.    OBJECTIVE IMPAIRMENTS: decreased mobility, difficulty walking, decreased ROM, decreased strength, increased fascial  restrictions, increased muscle spasms, impaired flexibility, improper body mechanics, postural dysfunction, and pain.   ACTIVITY LIMITATIONS: carrying, lifting, bending, sitting, standing, squatting, sleeping, stairs, transfers, bed mobility, continence, bathing, toileting, dressing, and caring for others  PARTICIPATION LIMITATIONS: meal prep, cleaning, laundry, driving, shopping, community activity, occupation, yard work, and church  PERSONAL FACTORS: Fitness, Past/current experiences, and 1 comorbidity: pregnancy are also affecting patient's functional outcome.   REHAB POTENTIAL: Good  CLINICAL DECISION MAKING: Stable/uncomplicated  EVALUATION COMPLEXITY: Low   GOALS: Goals reviewed with patient? Yes  SHORT TERM GOALS: Target date: 11/29/2023  Pain report to be no greater than 4/10  Baseline: Goal status: INITIAL  2.  Patient will be independent with initial HEP  Baseline:  Goal status: INITIAL   LONG TERM GOALS: Target date: 12/27/2023  Patient to report pain no greater than 2/10  Baseline:  Goal status: INITIAL  2.  Patient to be independent with advanced HEP  Baseline:  Goal status: INITIAL  3.  Patient to be able to sleep through the night  Baseline:  Goal status: INITIAL  4.  Patient to report 85% improvement in overall symptoms Baseline:  Goal status: INITIAL  5.  Functional tests to improve by 2-3  seconds Baseline:  Goal status: INITIAL   PLAN:  PT FREQUENCY: 1-2x/week  PT DURATION: 8 weeks  PLANNED INTERVENTIONS: 97110-Therapeutic exercises, 97530- Therapeutic activity, [id number]- Neuromuscular re-education, 97535- Self Care, [id number]- Manual therapy, [id number]- Gait training, [phone]- Aquatic Therapy, Patient/Family education, Taping, Spinal mobilization, Cryotherapy, and Moist heat.  PLAN FOR NEXT SESSION: 5 TSTS and TUG tests, Nustep, review HEP, begin pelvic stabilization and TA strengthening.    Delon B. Kutter Schnepf, PT 11/01/23 5:21 PM [facility]  Specialty Rehab Services [address], [address] [place], [place] [address] Phone # [phone] Fax [phone]

## 2023-11-13 ENCOUNTER — Encounter (HOSPITAL_COMMUNITY): Payer: Self-pay | Admitting: Obstetrics and Gynecology

## 2023-11-13 ENCOUNTER — Inpatient Hospital Stay (HOSPITAL_BASED_OUTPATIENT_CLINIC_OR_DEPARTMENT_OTHER)

## 2023-11-13 ENCOUNTER — Inpatient Hospital Stay (HOSPITAL_COMMUNITY)
Admission: AD | Admit: 2023-11-13 | Discharge: 2023-11-13 | Disposition: A | Discharge status: Home / Self Care | Attending: Family Medicine | Admitting: Family Medicine

## 2023-11-13 DIAGNOSIS — R1012 Left upper quadrant pain: Secondary | ICD-10-CM | POA: Diagnosis present

## 2023-11-13 DIAGNOSIS — O403XX0 Polyhydramnios, third trimester, not applicable or unspecified: Secondary | ICD-10-CM

## 2023-11-13 DIAGNOSIS — O99213 Obesity complicating pregnancy, third trimester: Secondary | ICD-10-CM

## 2023-11-13 DIAGNOSIS — D259 Leiomyoma of uterus, unspecified: Secondary | ICD-10-CM | POA: Diagnosis not present

## 2023-11-13 DIAGNOSIS — O3663X0 Maternal care for excessive fetal growth, third trimester, not applicable or unspecified: Secondary | ICD-10-CM

## 2023-11-13 DIAGNOSIS — R109 Unspecified abdominal pain: Secondary | ICD-10-CM

## 2023-11-13 DIAGNOSIS — O26893 Other specified pregnancy related conditions, third trimester: Secondary | ICD-10-CM | POA: Diagnosis not present

## 2023-11-13 DIAGNOSIS — O09293 Supervision of pregnancy with other poor reproductive or obstetric history, third trimester: Secondary | ICD-10-CM

## 2023-11-13 DIAGNOSIS — O3413 Maternal care for benign tumor of corpus uteri, third trimester: Secondary | ICD-10-CM | POA: Insufficient documentation

## 2023-11-13 DIAGNOSIS — O99613 Diseases of the digestive system complicating pregnancy, third trimester: Secondary | ICD-10-CM | POA: Insufficient documentation

## 2023-11-13 DIAGNOSIS — K219 Gastro-esophageal reflux disease without esophagitis: Secondary | ICD-10-CM | POA: Diagnosis not present

## 2023-11-13 DIAGNOSIS — Z3A36 36 weeks gestation of pregnancy: Secondary | ICD-10-CM

## 2023-11-13 DIAGNOSIS — E669 Obesity, unspecified: Secondary | ICD-10-CM | POA: Diagnosis not present

## 2023-11-13 DIAGNOSIS — Z3689 Encounter for other specified antenatal screening: Secondary | ICD-10-CM

## 2023-11-13 DIAGNOSIS — O26899 Other specified pregnancy related conditions, unspecified trimester: Secondary | ICD-10-CM

## 2023-11-13 DIAGNOSIS — O24419 Gestational diabetes mellitus in pregnancy, unspecified control: Secondary | ICD-10-CM | POA: Insufficient documentation

## 2023-11-13 LAB — CBC
HCT: 31.3 % — ABNORMAL LOW (ref 36.0–46.0)
Hemoglobin: 9.9 g/dL — ABNORMAL LOW (ref 12.0–15.0)
MCH: 23.6 pg — ABNORMAL LOW (ref 26.0–34.0)
MCHC: 31.6 g/dL (ref 30.0–36.0)
MCV: 74.5 fL — ABNORMAL LOW (ref 80.0–100.0)
Platelets: 339 K/uL (ref 150–400)
RBC: 4.2 MIL/uL (ref 3.87–5.11)
RDW: 15.2 % (ref 11.5–15.5)
WBC: 8 K/uL (ref 4.0–10.5)
nRBC: 0.3 % — ABNORMAL HIGH (ref 0.0–0.2)

## 2023-11-13 MED ORDER — ACETAMINOPHEN 500 MG PO TABS
1000.0000 mg | ORAL_TABLET | Freq: Once | ORAL | Status: AC
  Administered 2023-11-13: 1000 mg via ORAL
  Filled 2023-11-13: qty 2

## 2023-11-13 MED ORDER — CYCLOBENZAPRINE HCL 10 MG PO TABS: 10.0000 mg | ORAL_TABLET | Freq: Three times a day (TID) | ORAL | 1 refills | Status: DC | PRN

## 2023-11-13 NOTE — MAU Note (Incomplete)
 Sally Shannon is a 28 y.o. at [redacted]w[redacted]d here in MAU reporting: Upper Left Quadrant  pain that has been there for a few days but has gotten worse over the past 24hr. Reports she was told he had a fibroid and if she started having increased pain she needed to come in and get it checked out.  Good fetal movement reported and denies any vag bleeding or leaking. Reports a yellow vag discharge that is normal.   LMP:  Onset of complaint: yesterday Pain score: 6 Vitals:   11/13/23 1056  BP: 123/74  Pulse: (!) 106  Resp: 18  Temp: 98.3 F (36.8 C)     FHT: 158  Lab orders placed from triage:

## 2023-11-13 NOTE — MAU Provider Note (Addendum)
 MAU Provider Note  Chief Complaint: Abdominal Pain      SUBJECTIVE HPI: Sally Shannon is a 28 y.o. G2P1001 at [redacted]w[redacted]d by early ultrasound who presents to maternity admissions reporting one month of localized LUQ pain recently worsening. Pregnancy c/b A1GDM. Receives [facility] with [facility]. At her last routine prenatal visit, she reported epigastric pain inconsistent with her usual heartburn symptoms. The CNM suspected it was due to her known uterine fibroids. She reports one month of LUQ pain now worsening. Her pain is exacerbated with movement. 5/10 at rest. 8-9/10 with movement. She has tried tylenol  without benefit. No noted injury or muscle strain event. Has GERD, treated with PEPCID  BID, but this does not feel like her GERD symptoms and is not associated with eating. No contractions, leakage of fluid, vaginal bleeding. Does have vaginal pain and pelvic pressure.   She has normal bowel movements.     Past Medical History:  Diagnosis Date   Anemia    HLD (hyperlipidemia)    Vitamin D deficiency    Past Surgical History:  Procedure Laterality Date   WISDOM TOOTH EXTRACTION     Social History   Socioeconomic History   Marital status: Single    Spouse name: Not on file   Number of children: Not on file   Years of education: Not on file   Highest education level: Associate degree: occupational, Scientist, product/process development, or vocational program  Occupational History   Not on file  Tobacco Use   Smoking status: Never   Smokeless tobacco: Never  Vaping Use   Vaping status: Never Used  Substance and Sexual Activity   Alcohol  use: No    Alcohol /week: 0.0 standard drinks of alcohol    Drug use: No   Sexual activity: Yes    Birth control/protection: None  Other Topics Concern   Not on file  Social History Narrative   Not on file   Social Drivers of Health   Financial Resource Strain: Low Risk  (03/15/2023)   Overall Financial Resource Strain (CARDIA)    Difficulty of Paying Living Expenses:  Not hard at all  Food Insecurity: No Food Insecurity (03/15/2023)   Hunger Vital Sign    Worried About Running Out of Food in the Last Year: Never true    Ran Out of Food in the Last Year: Never true  Transportation Needs: No Transportation Needs (03/15/2023)   PRAPARE - Administrator, Civil Service (Medical): No    Lack of Transportation (Non-Medical): No  Physical Activity: Sufficiently Active (03/15/2023)   Exercise Vital Sign    Days of Exercise per Week: 5 days    Minutes of Exercise per Session: 30 min  Stress: No Stress Concern Present (03/15/2023)   [employer] of Occupational Health - Occupational Stress Questionnaire    Feeling of Stress : Only a little  Social Connections: Moderately Integrated (03/15/2023)   Social Connection and Isolation Panel    Frequency of Communication with Friends and Family: More than three times a week    Frequency of Social Gatherings with Friends and Family: Twice a week    Attends Religious Services: 1 to 4 times per year    Active Member of [employer] or Organizations: No    Attends Engineer, structural: Not on file    Marital Status: Married  Catering manager Violence: Not on file   No current facility-administered medications on file prior to encounter.   Current Outpatient Medications on File Prior to Encounter  Medication  Sig Dispense Refill   famotidine  (PEPCID ) 20 MG tablet Take 1 tablet (20 mg total) by mouth 2 (two) times daily. 60 tablet 4   Prenatal Vit-Fe Fumarate-FA (PRENATAL MULTIVITAMIN) TABS tablet Take 1 tablet by mouth daily at 12 noon.     Prenatal Vit-Fe Fumarate-FA (PRENATAL VITAMIN) 27-0.8 MG TABS Take 1 tablet by mouth daily. 30 tablet 12   Accu-Chek Softclix Lancets lancets Check glucose four times a day 100 each 12   Blood Glucose Monitoring Suppl (ACCU-CHEK GUIDE) w/Device KIT 1 kit by Does not apply route in the morning, at noon, in the evening, and at bedtime. 1 kit 0   glucose blood test  strip Check blood glucose four times a day 100 each 12   nystatin  (MYCOSTATIN /NYSTOP ) powder Apply 1 Application topically 3 (three) times daily. 15 g 0   No Known Allergies  ROS:  Pertinent positives/negatives listed above.  I have reviewed patient's Past Medical Hx, Surgical Hx, Family Hx, Social Hx, medications and allergies.   Physical Exam  Patient Vitals for the past 24 hrs:  BP Temp Pulse Resp Height Weight  11/13/23 1056 123/74 98.3 F (36.8 C) (!) 106 18 5' 4 (1.626 m) 107 kg   Constitutional: Well-developed, well-nourished female in no acute distress  Cardiovascular: normal rate Respiratory: normal effort GI: Abd soft, localized tenderness in the LUQ, no rebound or guarding, gravid, no palpable masses MS: trace edema Neurologic: Alert and oriented x 4   Cervical exam: cl/th/hi; chaperoned by Maurilio Hover RN   FHT:  Baseline 150 , moderate variability, accelerations present, no decelerations Contractions: irregular  LAB RESULTS Results for orders placed or performed during the hospital encounter of 11/13/23 (from the past 24 hours)  CBC     Status: Abnormal   Collection Time: 11/13/23  1:30 PM  Result Value Ref Range   WBC 8.0 4.0 - 10.5 K/uL   RBC 4.20 3.87 - 5.11 MIL/uL   Hemoglobin 9.9 (L) 12.0 - 15.0 g/dL   HCT 68.6 (L) 63.9 - 53.9 %   MCV 74.5 (L) 80.0 - 100.0 fL   MCH 23.6 (L) 26.0 - 34.0 pg   MCHC 31.6 30.0 - 36.0 g/dL   RDW 84.7 88.4 - 84.4 %   Platelets 339 150 - 400 K/uL   nRBC 0.3 (H) 0.0 - 0.2 %    O/Positive/-- (01/16 1644)  IMAGING US  MFM OB FOLLOW UP Result Date: 10/26/2023 ----------------------------------------------------------------------  OBSTETRICS REPORT                       (Signed Final 10/26/2023 04:43 pm) ---------------------------------------------------------------------- Patient Info  ID #:       [id number]                          D.O.B.:  1996/02/21 (27 yrs)(F)  Name:       Sally Shannon                     Visit Date: 10/26/2023  03:11 pm ---------------------------------------------------------------------- Performed By  Attending:        Steffan Keys MD         Ref. Address:     [facility]  Performed By:     Cosette Mor         [facility]  Referred By:      NORLEEN LULLA ROVER MD ---------------------------------------------------------------------- Orders  #  Description                           Code        Ordered By  1  US  MFM OB FOLLOW UP                   76816.01    FREDIA FRESH ----------------------------------------------------------------------  #  Order #                     Accession #                Episode #  1  [id number]                   [id number]                 [id number] ---------------------------------------------------------------------- Indications  Obesity complicating pregnancy, third          O99.212  trimester (BMI 36)  Polyhydramnios, third trimester, antepartum    O40.3XX1  condition or complication, fetus 1  Large for gestational age fetus affecting      O36.60X0  management of mother  Poor obstetric history: Previous gestational   O09.299  diabetes  Uterine fibroids affecting pregnancy in third  O32.12, D25.9  trimester, antepartum  Encounter for other antenatal screening        Z36.2  follow-up  Patient states LR NIPS  [redacted] weeks gestation of pregnancy                Z3A.33 ---------------------------------------------------------------------- Fetal Evaluation  Num Of Fetuses:         1  Fetal Heart Rate(bpm):  150  Cardiac Activity:       Observed  Presentation:           Cephalic  Placenta:               Anterior  P. Cord Insertion:      Previously seen  Amniotic Fluid  AFI FV:      Polyhydramnios  AFI Sum(cm)     %Tile       Largest Pocket(cm)  23.01            88          9.07  RUQ(cm)  RLQ(cm)       LUQ(cm)        LLQ(cm)  9.07          4.97          4.65           4.32 ---------------------------------------------------------------------- Biometry  BPD:      81.4  mm     G. Age:  32w 5d         16  %    CI:        65.74   %    70 - 86                                                          FL/HC:      21.2   %    19.4 - 21.8  HC:      322.2  mm     G. Age:  36w 3d         78  %    HC/AC:      0.97        0.96 - 1.11  AC:      332.7  mm     G. Age:  37w 1d       > 99  %    FL/BPD:     83.8   %    71 - 87  FL:       68.2  mm     G. Age:  35w 0d         72  %    FL/AC:      20.5   %    20 - 24  LV:        5.7  mm  Est. FW:    2835  gm      6 lb 4 oz     95  % ---------------------------------------------------------------------- OB History  Gravidity:    2         Term:   1  Living:       1 ---------------------------------------------------------------------- Gestational Age  LMP:           37w 4d        Date:  02/05/23                  EDD:   11/12/23  U/S Today:     35w 2d                                        EDD:   11/28/23  Best:          33w 6d     Det. By:  Early Ultrasound         EDD:   12/08/23                                      (05/01/23) ---------------------------------------------------------------------- Anatomy  Ventricles:            Appears normal         Stomach:  Appears normal, left                                                                        sided  Heart:                 Appears normal         Kidneys:                Appear normal                         (4CH, axis, and                         situs)  Diaphragm:             Appears normal         Bladder:                Appears normal ---------------------------------------------------------------------- Comments  Sally Shannon is currently at 34 weeks and 0 days.  She has  been followed due to maternal obesity with a BMI of 36.9.  The patient reports that her screening test  for gestational  diabetes could not be completed as she fainted after drinking  the glucose drink.  She is monitoring her fingersticks 4 times  a day.  I reviewed her fingerstick logs today.  The majority of her  fingerstick values are within the normal range.  On today's exam, the overall EFW of 6 pounds 4 ounces  measures large for her gestational age (95th percentile).  Borderline polyhydramnios with a total AFI of 23 cm with a  maximal vertical pocket of 9 cm was noted today.  The patient was advised that due to the large for gestational  age fetus and polyhydramnios noted today, she may have  gestational diabetes.  She was advised to continue to monitor her fingersticks 4  times a day.  Should the majority of her fingerstick values (greater than  50%) being greater than the stated range, she may to be  started on metformin or insulin for treatment.  Weekly fetal testing is recommended should she is require  insulin or metformin for treatment.  A follow-up growth scan was scheduled in 4 weeks.  Should a large for gestational age fetus be noted at that time,  delivery will be recommended at around 39 weeks.  The patient stated that all of her questions were answered  today.  A total of 20 minutes was spent counseling and coordinating  the care for this patient.  Greater than 50% of the time was  spent in direct face-to-face contact. ----------------------------------------------------------------------                   Steffan Keys, MD Electronically Signed Final Report   10/26/2023 04:43 pm ----------------------------------------------------------------------    MAU Management/MDM: Orders Placed This Encounter  Procedures   US  MFM OB LIMITED   CBC   Discharge patient    Meds ordered this encounter  Medications   acetaminophen  (TYLENOL ) tablet 1,000 mg   cyclobenzaprine  (FLEXERIL ) 10 MG tablet    Sig: Take 1 tablet (10 mg total) by mouth 3 (three) times daily  as needed for muscle spasms.     Dispense:  45 tablet    Refill:  1     Available prenatal records reviewed.  -DDX includes fibroid degeneration versus myofascial pain versus GERD or stomach ulcer. Most likely myofascial pain given the following: - ABD US  ordered to evaluate for fibroid degeneration. Uterine fibroid is not in the location of her pain - CBC appropriate for GA.  - Prescribed Flexeril  10 mg TID PRN for pain relief - Cervical exam and history nonconcerning for PTL - Continue OTC Tylenol  1000 mg q6h PRN for pain relief. - patient has GERD but states this is not like that pain not is it associated with eating. She takes Pepcid  BID. Encourages use of TUMS as well as needed to further delineate if this is associated with GERD or a stomach ulcer. - follow up with OP OB as scheduled   ASSESSMENT 1. Abdominal pain affecting pregnancy   2. Supervision of other normal pregnancy, antepartum   3. [redacted] weeks gestation of pregnancy     PLAN  - follow up with OP OB as scheduled - Discharge home with strict return precautions.  Allergies as of 11/13/2023   No Known Allergies      Medication List     TAKE these medications    Accu-Chek Guide w/Device Kit 1 kit by Does not apply route in the morning, at noon, in the evening, and at bedtime.   Accu-Chek Softclix Lancets lancets Check glucose four times a day   cyclobenzaprine  10 MG tablet Commonly known as: FLEXERIL  Take 1 tablet (10 mg total) by mouth 3 (three) times daily as needed for muscle spasms.   famotidine  20 MG tablet Commonly known as: Pepcid  Take 1 tablet (20 mg total) by mouth 2 (two) times daily.   glucose blood test strip Check blood glucose four times a day   nystatin  powder Commonly known as: MYCOSTATIN /NYSTOP  Apply 1 Application topically 3 (three) times daily.   prenatal multivitamin Tabs tablet Take 1 tablet by mouth daily at 12 noon.   Prenatal Vitamin 27-0.8 MG Tabs Take 1 tablet by mouth daily.         Reagan  Alena Morrison, MD  Family Medicine PGY1 11/13/2023  3:06 PM      I confirm that I have verified and agree with the information documented in the resident's note.   History Sally Shannon is a 28 y.o. G2P1001 at [redacted]w[redacted]d who presents with abdominal pain. Reports pain in left upper quadrant x 1 month. Pain is intermittent & worse with movement. Has been treating with tylenol  & heat without improvement. Denies n/v, diarrhea, constipation, contractions, LOF, dysuria, vaginal bleeding, fever/chills. +FM Has known uterine fibroids.   Physical exam BP 117/66   Pulse 100   Temp 98.3 F (36.8 C)   Resp 18   Ht 5' 4 (1.626 m)   Wt 107 kg   LMP 02/05/2023   SpO2 98%   BMI 40.51 kg/m   Physical Examination: General appearance - alert, well appearing, and in no distress Mental status - alert, oriented to person, place, and time Eyes - pupils equal and reactive, extraocular eye movements intact, sclera anicteric Chest - normal respiratory effort Abdomen - Gravid. Abd soft. No masses palpated. TTP over small area in LUQ. No hernia, rebound, or CVAT   Assessment/Plan 1. Abdominal pain affecting pregnancy   2. [redacted] weeks gestation of pregnancy   3. NST (non-stress test) reactive    -Patient presents  with LUQ x 1 month. Based on description of symptoms & exam,  most likely muscular pain. Pain is not over fibroids. Limited ultrasound is normal aside from mild polyhydramnios which was preexisting.  -Reactive NST. TOCO quiet. Cervix closed.  -Discussed tx with tylenol , heat, & flexeril . Rx flexeril  prn. At time of discharge, reports pain is now burning in nature. Discussed trial of Tums in addition to pepcid . If improves with tums, consider medication adjustment -Keep appt in office later this week  Jerilynn Longs, NP 11/13/2023 5:31 PM

## 2023-11-14 ENCOUNTER — Telehealth: Admitting: Physician Assistant

## 2023-11-14 ENCOUNTER — Telehealth: Payer: Self-pay | Admitting: Family Medicine

## 2023-11-14 DIAGNOSIS — G8929 Other chronic pain: Secondary | ICD-10-CM

## 2023-11-14 NOTE — Telephone Encounter (Signed)
 Needing to speak to someone in regards to her pain, she went to the ED and was told to call here to follow up.

## 2023-11-14 NOTE — Progress Notes (Signed)
 Virtual Visit Consent   Sally Shannon, you are scheduled for a virtual visit with a [facility] provider today. Just as with appointments in the office, your consent must be obtained to participate. Your consent will be active for this visit and any virtual visit you may have with one of our providers in the next 365 days. If you have a MyChart account, a copy of this consent can be sent to you electronically.  As this is a virtual visit, video technology does not allow for your provider to perform a traditional examination. This may limit your provider's ability to fully assess your condition. If your provider identifies any concerns that need to be evaluated in person or the need to arrange testing (such as labs, EKG, etc.), we will make arrangements to do so. Although advances in technology are sophisticated, we cannot ensure that it will always work on either your end or our end. If the connection with a video visit is poor, the visit may have to be switched to a telephone visit. With either a video or telephone visit, we are not always able to ensure that we have a secure connection.  By engaging in this virtual visit, you consent to the provision of healthcare and authorize for your insurance to be billed (if applicable) for the services provided during this visit. Depending on your insurance coverage, you may receive a charge related to this service.  I need to obtain your verbal consent now. Are you willing to proceed with your visit today? Sally Shannon has provided verbal consent on 11/14/2023 for a virtual visit (video or telephone). Elsie Velma Lunger, [place]  Date: 11/14/2023 10:35 AM   Virtual Visit via Video Note   I, Elsie Velma Lunger, connected with  Sally Shannon  ([id number], 1995-05-18) on 11/14/23 at 10:15 AM EDT by a video-enabled telemedicine application and verified that I am speaking with the correct person using two  identifiers.  Location: Patient: Virtual Visit Location Patient: Home Provider: Virtual Visit Location Provider: Home Office   I discussed the limitations of evaluation and management by telemedicine and the availability of in person appointments. The patient expressed understanding and agreed to proceed.    History of Present Illness: Sally Shannon is a 28 y.o. who identifies as a female who was assigned female at birth, and is being seen today for worsening ongoing LUQ pain. Was evaluated at MAU yesterday for this. Pelvic US  ruled out her fibroids as likely cause of symptoms. Was given Flexeril  for suspected myofascial pain and encouraged to continue regimen for her GERD. Notes this has continued without any improvement from the Flexeril . Is wanting to speak with her OBGYN about adjusting her induction date and next steps for this pain. Thought she was scheduling with them.   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   Acquired trigger finger of left index finger 01/14/2021   Sacroiliac joint pain 06/26/2019   Cervical spine pain 06/26/2019   Right knee pain 06/21/2017    Allergies: No Known Allergies Medications:  Current Outpatient Medications:    Accu-Chek Softclix Lancets lancets, Check glucose four times a day, Disp:  100 each, Rfl: 12   Blood Glucose Monitoring Suppl (ACCU-CHEK GUIDE) w/Device KIT, 1 kit by Does not apply route in the morning, at noon, in the evening, and at bedtime., Disp: 1 kit, Rfl: 0   cyclobenzaprine  (FLEXERIL ) 10 MG tablet, Take 1 tablet (10 mg total) by mouth 3 (three) times daily as needed for muscle spasms., Disp: 45 tablet, Rfl: 1   famotidine   (PEPCID ) 20 MG tablet, Take 1 tablet (20 mg total) by mouth 2 (two) times daily., Disp: 60 tablet, Rfl: 4   glucose blood test strip, Check blood glucose four times a day, Disp: 100 each, Rfl: 12   nystatin  (MYCOSTATIN /NYSTOP ) powder, Apply 1 Application topically 3 (three) times daily., Disp: 15 g, Rfl: 0   Prenatal Vit-Fe Fumarate-FA (PRENATAL MULTIVITAMIN) TABS tablet, Take 1 tablet by mouth daily at 12 noon., Disp: , Rfl:    Prenatal Vit-Fe Fumarate-FA (PRENATAL VITAMIN) 27-0.8 MG TABS, Take 1 tablet by mouth daily., Disp: 30 tablet, Rfl: 12  Observations/Objective: Patient is well-developed, well-nourished in no acute distress.  Resting comfortably at home.  Head is normocephalic, atraumatic.  No labored breathing. Speech is clear and coherent with logical content.  Patient is alert and oriented at baseline.   Assessment and Plan: 1. Chronic LUQ pain (Primary)  Ongoing, worsening with pregnancy. Persistent and no relief with muscle relaxant. Needs in person evaluation today which she is agreeable to. She is to also reach out directly to her OB regarding discussion of induction date. No charge for visit.   Follow Up Instructions: I discussed the assessment and treatment plan with the patient. The patient was provided an opportunity to ask questions and all were answered. The patient agreed with the plan and demonstrated an understanding of the instructions.  A copy of instructions were sent to the patient via MyChart unless otherwise noted below.   The patient was advised to call back or seek an in-person evaluation if the symptoms worsen or if the condition fails to improve as anticipated.    Elsie Velma Lunger, PA-C

## 2023-11-15 ENCOUNTER — Inpatient Hospital Stay (HOSPITAL_COMMUNITY)

## 2023-11-15 ENCOUNTER — Other Ambulatory Visit: Payer: Self-pay

## 2023-11-15 ENCOUNTER — Encounter (HOSPITAL_COMMUNITY): Payer: Self-pay | Admitting: Obstetrics and Gynecology

## 2023-11-15 ENCOUNTER — Inpatient Hospital Stay (HOSPITAL_COMMUNITY)
Admission: AD | Admit: 2023-11-15 | Discharge: 2023-11-15 | Disposition: A | Discharge status: Home / Self Care | Attending: Obstetrics and Gynecology | Admitting: Obstetrics and Gynecology

## 2023-11-15 DIAGNOSIS — O4703 False labor before 37 completed weeks of gestation, third trimester: Secondary | ICD-10-CM

## 2023-11-15 DIAGNOSIS — O26893 Other specified pregnancy related conditions, third trimester: Secondary | ICD-10-CM | POA: Diagnosis present

## 2023-11-15 DIAGNOSIS — Z3A36 36 weeks gestation of pregnancy: Secondary | ICD-10-CM | POA: Diagnosis not present

## 2023-11-15 DIAGNOSIS — K219 Gastro-esophageal reflux disease without esophagitis: Secondary | ICD-10-CM | POA: Diagnosis not present

## 2023-11-15 DIAGNOSIS — O99613 Diseases of the digestive system complicating pregnancy, third trimester: Secondary | ICD-10-CM | POA: Insufficient documentation

## 2023-11-15 DIAGNOSIS — R1010 Upper abdominal pain, unspecified: Secondary | ICD-10-CM

## 2023-11-15 DIAGNOSIS — R1011 Right upper quadrant pain: Secondary | ICD-10-CM | POA: Insufficient documentation

## 2023-11-15 LAB — URINALYSIS, ROUTINE W REFLEX MICROSCOPIC
Bilirubin Urine: NEGATIVE
Glucose, UA: NEGATIVE mg/dL
Hgb urine dipstick: NEGATIVE
Ketones, ur: NEGATIVE mg/dL
Leukocytes,Ua: NEGATIVE
Nitrite: NEGATIVE
Protein, ur: NEGATIVE mg/dL
Specific Gravity, Urine: 1.003 — ABNORMAL LOW (ref 1.005–1.030)
pH: 6 (ref 5.0–8.0)

## 2023-11-15 MED ORDER — METOCLOPRAMIDE HCL 10 MG PO TABS: 10.0000 mg | ORAL_TABLET | Freq: Four times a day (QID) | ORAL | 0 refills | Status: DC

## 2023-11-15 MED ORDER — ACETAMINOPHEN 500 MG PO TABS
1000.0000 mg | ORAL_TABLET | Freq: Once | ORAL | Status: AC
  Administered 2023-11-15: 1000 mg via ORAL
  Filled 2023-11-15: qty 2

## 2023-11-15 MED ORDER — SOD CITRATE-CITRIC ACID 500-334 MG/5ML PO SOLN
30.0000 mL | Freq: Once | ORAL | Status: AC
  Administered 2023-11-15: 30 mL via ORAL
  Filled 2023-11-15: qty 30

## 2023-11-15 MED ORDER — CYCLOBENZAPRINE HCL 5 MG PO TABS
10.0000 mg | ORAL_TABLET | Freq: Once | ORAL | Status: AC
  Administered 2023-11-15: 10 mg via ORAL
  Filled 2023-11-15: qty 2

## 2023-11-15 MED ORDER — LACTATED RINGERS IV SOLN: INTRAVENOUS | Status: DC

## 2023-11-15 MED ORDER — OXYCODONE HCL 5 MG PO TABS
10.0000 mg | ORAL_TABLET | Freq: Once | ORAL | Status: AC
  Administered 2023-11-15: 10 mg via ORAL
  Filled 2023-11-15: qty 2

## 2023-11-15 MED ORDER — FAMOTIDINE IN NACL 20-0.9 MG/50ML-% IV SOLN
20.0000 mg | Freq: Once | INTRAVENOUS | Status: DC
Start: 2023-11-15 — End: 2023-11-15
  Filled 2023-11-15: qty 50

## 2023-11-15 MED ORDER — FAMOTIDINE 20 MG PO TABS
20.0000 mg | ORAL_TABLET | Freq: Once | ORAL | Status: AC
  Administered 2023-11-15: 20 mg via ORAL
  Filled 2023-11-15: qty 1

## 2023-11-15 MED ORDER — PANTOPRAZOLE SODIUM 40 MG PO TBEC: 40.0000 mg | DELAYED_RELEASE_TABLET | Freq: Every day | ORAL | 0 refills | Status: DC

## 2023-11-15 NOTE — Telephone Encounter (Signed)
-----   Message from [facility] Latoya L sent at 11/14/2023  2:24 PM EDT ----- Regarding: Triage Call/Pain FYI patient called and lvm stating she is a [facility] pt however, she wants to speak with someone regarding her pain, I saw pt had a virtual visit and there is a telephone note that she called you all regarding pain and Induction date /has Upcoming appt this week as well.

## 2023-11-15 NOTE — MAU Note (Signed)
 Sally Shannon is a 28 y.o. at [redacted]w[redacted]d here in MAU reporting: she's having upper abdominal pain since Saturday.  Reports was seen in MAU on Monday, given muscle relaxer but the pain has both continued and worsened.  Denies LOF and VB.  Endorses +FM.  LMP: 02/05/2023 Onset of complaint: Saturday Pain score: 8 Vitals:   11/15/23 0900  BP: 126/73  Pulse: 94  Resp: 18  Temp: 98 F (36.7 C)  SpO2: 100%     FHT: deferred secondary maternal apparel  Lab orders placed from triage: UA

## 2023-11-15 NOTE — MAU Provider Note (Signed)
 Chief Complaint:  Abdominal Pain   HPI    Sally Shannon is a 28 y.o. G2P1001 at [redacted]w[redacted]d who presents to maternity admissions reporting she's been having upper abdominal pain more to the right side since Saturday. Denies any N/V fever, chills. She reports she was seen in the MAU on Monday 11/13/23 and was given flexeril  with mild relief only. She reports she is having difficulty sleeping due to the pain. Seen also on 11/14/23 with a DX of GERD which is likely causing her discomfort.  She denies any OB c/o. No VB, LOF, or lower abdominal pain or contractions  Pregnancy Course: [facility]   Past Medical History:  Diagnosis Date   Anemia    HLD (hyperlipidemia)    Vitamin D deficiency    OB History  Gravida Para Term Preterm AB Living  2 1 1   1   SAB IAB Ectopic Multiple Live Births     0 1    # Outcome Date GA Lbr Len/2nd Weight Sex Type Anes PTL Lv  2 Current           1 Term 05/19/16 [redacted]w[redacted]d 12:26 / 00:25 3320 g F Vag-Spont EPI  LIV     Birth Comments: WDL   Past Surgical History:  Procedure Laterality Date   WISDOM TOOTH EXTRACTION     Family History  Problem Relation Age of Onset   Anemia Mother    Depression Mother    Diabetes Mother    Anemia Maternal Grandmother    Social History   Tobacco Use   Smoking status: Never   Smokeless tobacco: Never  Vaping Use   Vaping status: Never Used  Substance Use Topics   Alcohol  use: No    Alcohol /week: 0.0 standard drinks of alcohol    Drug use: No   Allergies  Allergen Reactions   Pork-Derived Products    Medications Prior to Admission  Medication Sig Dispense Refill Last Dose/Taking   cyclobenzaprine  (FLEXERIL ) 10 MG tablet Take 1 tablet (10 mg total) by mouth 3 (three) times daily as needed for muscle spasms. 45 tablet 1 Past Week   famotidine  (PEPCID ) 20 MG tablet Take 1 tablet (20 mg total) by mouth 2 (two) times daily. 60 tablet 4 11/15/2023 Morning   Prenatal Vit-Fe Fumarate-FA (PRENATAL MULTIVITAMIN)  TABS tablet Take 1 tablet by mouth daily at 12 noon.   Past Month   Prenatal Vit-Fe Fumarate-FA (PRENATAL VITAMIN) 27-0.8 MG TABS Take 1 tablet by mouth daily. 30 tablet 12 Past Month   Accu-Chek Softclix Lancets lancets Check glucose four times a day 100 each 12    Blood Glucose Monitoring Suppl (ACCU-CHEK GUIDE) w/Device KIT 1 kit by Does not apply route in the morning, at noon, in the evening, and at bedtime. 1 kit 0    glucose blood test strip Check blood glucose four times a day 100 each 12    nystatin  (MYCOSTATIN /NYSTOP ) powder Apply 1 Application topically 3 (three) times daily. 15 g 0     I have reviewed patient's Past Medical Hx, Surgical Hx, Family Hx, Social Hx, medications and allergies.   ROS  Pertinent items noted in HPI and remainder of comprehensive ROS otherwise negative.   PHYSICAL EXAM  Patient Vitals for the past 24 hrs:  BP Temp Temp src Pulse Resp SpO2 Height Weight  11/15/23 0915 122/80 -- -- (!) 105 -- -- -- --  11/15/23 0900 126/73 98 F (36.7 C) Oral 94 18 100 % -- --  11/15/23 0854 -- -- -- -- -- -- 5' 4 (1.626 m) 108.6 kg    Constitutional: Well-developed, obese  female in no acute distress.  Cardiovascular: normal rate & rhythm, warm and well-perfused Respiratory: normal effort, no problems with respiration noted GI: Abd soft, mild pain in RUQ illicited on palpation , no guarding, no rebound, no rigidity  MS: Extremities nontender, no edema, normal ROM Neurologic: Alert and oriented x 4.  GU: no CVA tenderness Pelvic: Chaperoned by Nat Lacy RN  Dilation: Closed Effacement (%): Thick Station: Ballotable Exam by:: FREDRIK Dalton, NP  Fetal Tracing: Cat 1 Reactive  Baseline:145 Variability: moderate  Accelerations: present Decelerations:absent Toco: ctx 3-5    Labs: Results for orders placed or performed during the hospital encounter of 11/15/23 (from the past 24 hours)  Urinalysis, Routine w reflex microscopic -Urine, Clean Catch     Status:  Abnormal   Collection Time: 11/15/23  9:02 AM  Result Value Ref Range   Color, Urine STRAW (A) YELLOW   APPearance CLEAR CLEAR   Specific Gravity, Urine 1.003 (L) 1.005 - 1.030   pH 6.0 5.0 - 8.0   Glucose, UA NEGATIVE NEGATIVE mg/dL   Hgb urine dipstick NEGATIVE NEGATIVE   Bilirubin Urine NEGATIVE NEGATIVE   Ketones, ur NEGATIVE NEGATIVE mg/dL   Protein, ur NEGATIVE NEGATIVE mg/dL   Nitrite NEGATIVE NEGATIVE   Leukocytes,Ua NEGATIVE NEGATIVE    Imaging:  US  Abdomen Limited RUQ (LIVER/GB) Result Date: 11/15/2023 CLINICAL DATA:  [id number] Chronic RUQ pain [id number]. EXAM: ULTRASOUND ABDOMEN LIMITED RIGHT UPPER QUADRANT COMPARISON:  None Available. FINDINGS: Gallbladder: No gallstones or wall thickening visualized. No sonographic Murphy sign noted by sonographer. Common bile duct: Diameter: Up to 3.2 mm.  No intrahepatic bile duct dilation. Liver: No focal lesion identified. Within normal limits in parenchymal echogenicity. Portal vein is patent on color Doppler imaging with normal direction of blood flow towards the liver. Other: None. IMPRESSION: *Unremarkable right upper quadrant ultrasound examination. Electronically Signed   By: Ree Molt M.D.   On: 11/15/2023 09:56    MDM & MAU COURSE  MDM:  HIGH  RUQ pain in pregnancy  Concern for cholecystitis/cholelithiasis  Likely GERD  CBC CMP UA Labs to r/o infection and check LFT's RUQ ultrasound ordered ( Unremarkable with no evidence of gall bladder disease) Pepcid , Bicitra for likely GERD Tylenol  and Flexeril  for pain  RN informed me that patient refused IV and Labs Ordered Bicitra and Pepcid  PO  Will reassess   Reassessed at 1145 due to CTX on Toco and patient still in discomfort  SVE: L/C Will give PO dose of oxycodone  10 mg x 1 to help break the pain cycle but patient is aware that this is a one time dose and that she will not get narcotics at discharge  No evidence of acute pathology or active labor at this time. Follow  up with OB provider as scheduled  MAU Course: Orders Placed This Encounter  Procedures   Wet prep, genital   US  Abdomen Limited RUQ (LIVER/GB)   Urinalysis, Routine w reflex microscopic -Urine, Clean Catch   CBC   Comprehensive metabolic panel   Discharge patient Discharge disposition: 01-Home or Self Care; Discharge patient date: 11/15/2023   Meds ordered this encounter  Medications   lactated ringers  infusion   DISCONTD: famotidine  (PEPCID ) IVPB 20 mg premix   acetaminophen  (TYLENOL ) tablet 1,000 mg   cyclobenzaprine  (FLEXERIL ) tablet 10 mg   famotidine  (PEPCID ) tablet 20 mg   sodium citrate -citric acid  (ORACIT)  solution 30 mL   oxyCODONE  (Oxy IR/ROXICODONE ) immediate release tablet 10 mg    Refill:  0   pantoprazole  (PROTONIX ) 40 MG tablet    Sig: Take 1 tablet (40 mg total) by mouth daily.    Dispense:  30 tablet    Refill:  0    Supervising Provider:   PRATT, TANYA S [[id number]]   metoCLOPramide  (REGLAN ) 10 MG tablet    Sig: Take 1 tablet (10 mg total) by mouth every 6 (six) hours.    Dispense:  30 tablet    Refill:  0    Supervising Provider:   FREDIRICK GLENYS GORMAN KATHRINE   Study Result  Narrative & Impression  CLINICAL DATA:  [phone] Chronic RUQ pain [phone].   EXAM: ULTRASOUND ABDOMEN LIMITED RIGHT UPPER QUADRANT   COMPARISON:  None Available.   FINDINGS: Gallbladder:   No gallstones or wall thickening visualized. No sonographic Murphy sign noted by sonographer.   Common bile duct:   Diameter: Up to 3.2 mm.  No intrahepatic bile duct dilation.   Liver:   No focal lesion identified. Within normal limits in parenchymal echogenicity. Portal vein is patent on color Doppler imaging with normal direction of blood flow towards the liver.   Other: None.   IMPRESSION: *Unremarkable right upper quadrant ultrasound examination.     Electronically Signed   By: Ree Molt M.D.   On: 11/15/2023 09:56     I have reviewed the patient chart and performed the  physical exam . I have ordered & interpreted the lab results and reviewed and interpreted the NST Medications ordered as stated below.  A/P as described below.  Counseling and education provided and patient agreeable  with plan as described below. Verbalized understanding.    ASSESSMENT   1. Upper abdominal pain   2. [redacted] weeks gestation of pregnancy   3. False labor before 37 completed weeks of gestation in third trimester     PLAN  Discharge home in stable condition with return precautions.   Will discharge home with GI Cocktail as listed below  Follow up with your [facility] Provider as scheduled  See AVS for full description of information given to the patient including both verbal and written. Patient verbalized understanding and agrees with the plan as described above.     Follow-up Information     [facility] for [facility] Follow up.   Specialty: Obstetrics and Gynecology Why: If symptoms worsen or fail to resolve, As scheduled for ongoing prenatal care Contact information: [address] [place]  [address] [phone]                Allergies as of 11/15/2023       Reactions   Pork-derived Products         Medication List     TAKE these medications    Accu-Chek Guide w/Device Kit 1 kit by Does not apply route in the morning, at noon, in the evening, and at bedtime.   Accu-Chek Softclix Lancets lancets Check glucose four times a day   cyclobenzaprine  10 MG tablet Commonly known as: FLEXERIL  Take 1 tablet (10 mg total) by mouth 3 (three) times daily as needed for muscle spasms.   famotidine  20 MG tablet Commonly known as: Pepcid  Take 1 tablet (20 mg total) by mouth 2 (two) times daily.   glucose blood test strip Check blood glucose four times a day   metoCLOPramide  10 MG tablet Commonly known as:  REGLAN  Take 1 tablet (10 mg total) by mouth every 6 (six) hours.   nystatin  powder Commonly known  as: MYCOSTATIN /NYSTOP  Apply 1 Application topically 3 (three) times daily.   pantoprazole  40 MG tablet Commonly known as: Protonix  Take 1 tablet (40 mg total) by mouth daily.   prenatal multivitamin Tabs tablet Take 1 tablet by mouth daily at 12 noon.   Prenatal Vitamin 27-0.8 MG Tabs Take 1 tablet by mouth daily.        Olam Dalton, MSN, [facility], Center for [employer]

## 2023-11-15 NOTE — Discharge Instructions (Addendum)
 Your Induction has been moved up to 11/30/23 at [redacted] weeks Gestational Age

## 2023-11-17 ENCOUNTER — Other Ambulatory Visit: Payer: Self-pay

## 2023-11-17 ENCOUNTER — Encounter (HOSPITAL_COMMUNITY): Payer: Self-pay | Admitting: *Deleted

## 2023-11-17 ENCOUNTER — Encounter: Payer: Self-pay | Admitting: Family Medicine

## 2023-11-17 ENCOUNTER — Telehealth (HOSPITAL_COMMUNITY): Payer: Self-pay | Admitting: *Deleted

## 2023-11-17 ENCOUNTER — Ambulatory Visit: Admitting: Family Medicine

## 2023-11-17 VITALS — BP 120/77 | HR 106 | Wt 236.4 lb

## 2023-11-17 DIAGNOSIS — O24410 Gestational diabetes mellitus in pregnancy, diet controlled: Secondary | ICD-10-CM | POA: Diagnosis not present

## 2023-11-17 DIAGNOSIS — Z3A37 37 weeks gestation of pregnancy: Secondary | ICD-10-CM | POA: Diagnosis not present

## 2023-11-17 DIAGNOSIS — Z3480 Encounter for supervision of other normal pregnancy, unspecified trimester: Secondary | ICD-10-CM

## 2023-11-17 DIAGNOSIS — O99013 Anemia complicating pregnancy, third trimester: Secondary | ICD-10-CM | POA: Diagnosis not present

## 2023-11-17 LAB — GC/CHLAMYDIA PROBE AMP (~~LOC~~) NOT AT ARMC
Chlamydia: NEGATIVE
Comment: NEGATIVE
Comment: NORMAL
Neisseria Gonorrhea: NEGATIVE

## 2023-11-17 NOTE — Progress Notes (Signed)
   Subjective:  Sally Shannon is a 28 y.o. G2P1001 at [redacted]w[redacted]d being seen today for ongoing prenatal care.  She is currently monitored for the following issues for this high-risk pregnancy and has Acquired trigger finger of left index finger; Anemia complicating pregnancy, third trimester; HLD (hyperlipidemia); Supervision of other normal pregnancy, antepartum; and Gestational diabetes, diet controlled on their problem list.  Patient reports ongoing pain in RUQ. Went to MAU two days prior, RUQ US  was unremarkable.  Contractions: Irritability. Vag. Bleeding: None.  Movement: Present. Denies leaking of fluid.   The following portions of the patient's history were reviewed and updated as appropriate: allergies, current medications, past family history, past medical history, past social history, past surgical history and problem list. Problem list updated.  Objective:   Vitals:   11/17/23 1033  BP: 120/77  Pulse: (!) 106  Weight: 236 lb 6.4 oz (107.2 kg)    Fetal Status: Fetal Heart Rate (bpm): 155   Movement: Present     General:  Alert, oriented and cooperative. Patient is in no acute distress.  Skin: Skin is warm and dry. No rash noted.   Cardiovascular: Normal heart rate noted  Respiratory: Normal respiratory effort, no problems with respiration noted  Abdomen: Soft, gravid, appropriate for gestational age. Pain/Pressure: Present     Pelvic: Vag. Bleeding: None     Cervical exam deferred        Extremities: Normal range of motion.  Edema: None  Mental Status: Normal mood and affect. Normal behavior. Normal judgment and thought content.   Urinalysis:      Assessment and Plan:  Pregnancy: G2P1001 at [redacted]w[redacted]d  1. Supervision of other normal pregnancy, antepartum (Primary) BP and FHR normal GC/CT collected in MAU two days prior GBS swab collected Cephalic on US  four days ago Ongoing LUQ pain, had unremarkable RUQ US  Reports movement makes it worse, pain is constant and  burning Eating does not seem to be related - Culture, beta strep (group b only)  2. Diet controlled gestational diabetes mellitus (GDM) in third trimester Forgot log, on recall fastings are controlled but post prandials sometimes range into 130's though also mostly controlled Last growth US  10/26/2023, EFW 95%, AC>99%, AFI 23 with MVP 9 cm c/w polyhydramnios Given LGA and signs of poor control on US , recommend delivery sooner rather than later She is very amenable as she's miserable with her LUQ pain, scheduled for 11/20/2023 Form faxed, orders placed  3. Anemia complicating pregnancy, third trimester Lab Results  Component Value Date   HGB 9.9 (L) 11/13/2023  Not on PO iron but late in the game, f/u CBC postpartum   Term labor symptoms and general obstetric precautions including but not limited to vaginal bleeding, contractions, leaking of fluid and fetal movement were reviewed in detail with the patient. Please refer to After Visit Summary for other counseling recommendations.  No follow-ups on file.   [name] HERO, MD

## 2023-11-17 NOTE — Telephone Encounter (Signed)
 Preadmission screen

## 2023-11-20 ENCOUNTER — Ambulatory Visit: Admitting: Physical Therapy

## 2023-11-20 ENCOUNTER — Encounter (HOSPITAL_COMMUNITY): Payer: Self-pay | Admitting: Family Medicine

## 2023-11-20 ENCOUNTER — Inpatient Hospital Stay (HOSPITAL_COMMUNITY)
Admission: RE | Admit: 2023-11-20 | Discharge: 2023-11-23 | DRG: 806 | Disposition: A | Discharge status: Home / Self Care | Payer: Self-pay | Attending: Obstetrics and Gynecology | Admitting: Obstetrics and Gynecology

## 2023-11-20 ENCOUNTER — Encounter (HOSPITAL_COMMUNITY): Payer: Self-pay

## 2023-11-20 ENCOUNTER — Other Ambulatory Visit: Payer: Self-pay

## 2023-11-20 ENCOUNTER — Ambulatory Visit: Payer: Self-pay | Admitting: Family Medicine

## 2023-11-20 ENCOUNTER — Inpatient Hospital Stay (HOSPITAL_COMMUNITY): Admission: RE | Admit: 2023-11-20 | Source: Ambulatory Visit

## 2023-11-20 DIAGNOSIS — M792 Neuralgia and neuritis, unspecified: Secondary | ICD-10-CM | POA: Diagnosis present

## 2023-11-20 DIAGNOSIS — T413X5A Adverse effect of local anesthetics, initial encounter: Secondary | ICD-10-CM | POA: Diagnosis not present

## 2023-11-20 DIAGNOSIS — Z3A37 37 weeks gestation of pregnancy: Secondary | ICD-10-CM

## 2023-11-20 DIAGNOSIS — I952 Hypotension due to drugs: Secondary | ICD-10-CM | POA: Diagnosis not present

## 2023-11-20 DIAGNOSIS — O9902 Anemia complicating childbirth: Secondary | ICD-10-CM | POA: Diagnosis present

## 2023-11-20 DIAGNOSIS — O403XX0 Polyhydramnios, third trimester, not applicable or unspecified: Secondary | ICD-10-CM | POA: Diagnosis present

## 2023-11-20 DIAGNOSIS — R55 Syncope and collapse: Secondary | ICD-10-CM | POA: Diagnosis not present

## 2023-11-20 DIAGNOSIS — O24415 Gestational diabetes mellitus in pregnancy, controlled by oral hypoglycemic drugs: Secondary | ICD-10-CM

## 2023-11-20 DIAGNOSIS — R1012 Left upper quadrant pain: Secondary | ICD-10-CM | POA: Diagnosis not present

## 2023-11-20 DIAGNOSIS — O99013 Anemia complicating pregnancy, third trimester: Secondary | ICD-10-CM | POA: Diagnosis present

## 2023-11-20 DIAGNOSIS — D259 Leiomyoma of uterus, unspecified: Secondary | ICD-10-CM | POA: Diagnosis present

## 2023-11-20 DIAGNOSIS — O99824 Streptococcus B carrier state complicating childbirth: Secondary | ICD-10-CM | POA: Diagnosis present

## 2023-11-20 DIAGNOSIS — Z3480 Encounter for supervision of other normal pregnancy, unspecified trimester: Secondary | ICD-10-CM

## 2023-11-20 DIAGNOSIS — O3413 Maternal care for benign tumor of corpus uteri, third trimester: Secondary | ICD-10-CM | POA: Diagnosis present

## 2023-11-20 DIAGNOSIS — O99820 Streptococcus B carrier state complicating pregnancy: Secondary | ICD-10-CM | POA: Diagnosis not present

## 2023-11-20 DIAGNOSIS — O9A22 Injury, poisoning and certain other consequences of external causes complicating childbirth: Secondary | ICD-10-CM | POA: Diagnosis not present

## 2023-11-20 DIAGNOSIS — O24420 Gestational diabetes mellitus in childbirth, diet controlled: Principal | ICD-10-CM | POA: Diagnosis present

## 2023-11-20 DIAGNOSIS — O24419 Gestational diabetes mellitus in pregnancy, unspecified control: Secondary | ICD-10-CM | POA: Diagnosis present

## 2023-11-20 DIAGNOSIS — O3663X0 Maternal care for excessive fetal growth, third trimester, not applicable or unspecified: Secondary | ICD-10-CM | POA: Diagnosis present

## 2023-11-20 DIAGNOSIS — O24410 Gestational diabetes mellitus in pregnancy, diet controlled: Secondary | ICD-10-CM

## 2023-11-20 DIAGNOSIS — O409XX0 Polyhydramnios, unspecified trimester, not applicable or unspecified: Secondary | ICD-10-CM | POA: Diagnosis present

## 2023-11-20 DIAGNOSIS — Z833 Family history of diabetes mellitus: Secondary | ICD-10-CM

## 2023-11-20 DIAGNOSIS — O99355 Diseases of the nervous system complicating the puerperium: Secondary | ICD-10-CM | POA: Diagnosis present

## 2023-11-20 HISTORY — DX: Polyhydramnios, unspecified trimester, not applicable or unspecified: O40.9XX0

## 2023-11-20 LAB — CBC
HCT: 34.2 % — ABNORMAL LOW (ref 36.0–46.0)
Hemoglobin: 10.7 g/dL — ABNORMAL LOW (ref 12.0–15.0)
MCH: 22.8 pg — ABNORMAL LOW (ref 26.0–34.0)
MCHC: 31.3 g/dL (ref 30.0–36.0)
MCV: 72.9 fL — ABNORMAL LOW (ref 80.0–100.0)
Platelets: 396 K/uL (ref 150–400)
RBC: 4.69 MIL/uL (ref 3.87–5.11)
RDW: 15.5 % (ref 11.5–15.5)
WBC: 8.8 K/uL (ref 4.0–10.5)
nRBC: 0 % (ref 0.0–0.2)

## 2023-11-20 LAB — TYPE AND SCREEN
ABO/RH(D): O POS
Antibody Screen: NEGATIVE

## 2023-11-20 LAB — GLUCOSE, CAPILLARY: Glucose-Capillary: 66 mg/dL — ABNORMAL LOW (ref 70–99)

## 2023-11-20 LAB — CULTURE, BETA STREP (GROUP B ONLY): Strep Gp B Culture: POSITIVE — AB

## 2023-11-20 MED ORDER — SOD CITRATE-CITRIC ACID 500-334 MG/5ML PO SOLN
30.0000 mL | ORAL | Status: DC | PRN
  Administered 2023-11-20: 30 mL via ORAL
  Filled 2023-11-20: qty 30

## 2023-11-20 MED ORDER — SODIUM CHLORIDE 0.9 % IV SOLN
5.0000 10*6.[IU] | Freq: Once | INTRAVENOUS | Status: AC
  Administered 2023-11-20: 5 10*6.[IU] via INTRAVENOUS
  Filled 2023-11-20: qty 5

## 2023-11-20 MED ORDER — PENICILLIN G POT IN DEXTROSE 60000 UNIT/ML IV SOLN
3.0000 10*6.[IU] | INTRAVENOUS | Status: DC
  Administered 2023-11-21 (×3): 3 10*6.[IU] via INTRAVENOUS
  Filled 2023-11-20 (×7): qty 50

## 2023-11-20 MED ORDER — MISOPROSTOL 50MCG HALF TABLET
50.0000 ug | ORAL_TABLET | ORAL | Status: DC
  Administered 2023-11-20: 50 ug via BUCCAL
  Filled 2023-11-20: qty 1

## 2023-11-20 MED ORDER — LACTATED RINGERS IV SOLN
500.0000 mL | INTRAVENOUS | Status: DC | PRN
  Administered 2023-11-21 (×2): 500 mL via INTRAVENOUS

## 2023-11-20 MED ORDER — PENICILLIN G POTASSIUM 5000000 UNITS IJ SOLR: 5.0000 10*6.[IU] | Freq: Once | INTRAMUSCULAR | Status: DC

## 2023-11-20 MED ORDER — PENICILLIN G POT IN DEXTROSE 60000 UNIT/ML IV SOLN: 3.0000 10*6.[IU] | INTRAVENOUS | Status: DC

## 2023-11-20 MED ORDER — LIDOCAINE HCL (PF) 1 % IJ SOLN: 30.0000 mL | INTRAMUSCULAR | Status: DC | PRN

## 2023-11-20 MED ORDER — ACETAMINOPHEN 325 MG PO TABS: 650.0000 mg | ORAL_TABLET | ORAL | Status: DC | PRN

## 2023-11-20 MED ORDER — OXYTOCIN-SODIUM CHLORIDE 30-0.9 UT/500ML-% IV SOLN
2.5000 [IU]/h | INTRAVENOUS | Status: DC
  Administered 2023-11-21: 2.5 [IU]/h via INTRAVENOUS
  Filled 2023-11-20: qty 500

## 2023-11-20 MED ORDER — OXYTOCIN BOLUS FROM INFUSION
333.0000 mL | Freq: Once | INTRAVENOUS | Status: AC
  Administered 2023-11-21: 333 mL via INTRAVENOUS

## 2023-11-20 MED ORDER — FENTANYL CITRATE (PF) 100 MCG/2ML IJ SOLN
50.0000 ug | INTRAMUSCULAR | Status: DC | PRN
  Administered 2023-11-21: 50 ug via INTRAVENOUS
  Filled 2023-11-20: qty 2

## 2023-11-20 MED ORDER — ONDANSETRON HCL 4 MG/2ML IJ SOLN: 4.0000 mg | Freq: Four times a day (QID) | INTRAMUSCULAR | Status: DC | PRN

## 2023-11-20 MED ORDER — LACTATED RINGERS IV SOLN: INTRAVENOUS | Status: DC

## 2023-11-20 NOTE — Progress Notes (Signed)
 Labor Progress Note Sally Shannon is a 28 y.o. G2P1001 at [redacted]w[redacted]d presented for IOL 2/2 A1GDM and LGA.   S:  Coping well  O:  BP 128/86   Pulse (!) 111   Temp 98.6 F (37 C) (Oral)   Resp 18   Ht 5' 4 (1.626 m)   Wt 108.8 kg   LMP 02/05/2023   BMI 41.18 kg/m   EFM: baseline 155 bpm/ moderate variability/ 15x15 accels/ absent decels  Toco/IUPC: 4-5 SVE: Dilation: Closed Effacement (%): Thick Station: -3 Presentation: Vertex Exam by:: Alan Sandifer, RN Pitocin : 0 mu/min  A/P: 28 y.o. G2P1001 [redacted]w[redacted]d IOL 2/2 A1GDM and LGA  1. Labor: IOL in latent phase with cervical ripening indicated.  S/p cytotec . Will evaluate for repeat cytotec  vs. FB vs. AROM in approximately 2 hours. Discussed with patient who states she is agreeable to all of these interventions when appropriate.  2. FWB: Cat 1  3. Pain: Coping well, prefers unmedicated as long as possible 4. GBS pos adequately treated.    Anticipate NVSB.  Camie DELENA Rote, CNM 9:38 PM

## 2023-11-20 NOTE — H&P (Addendum)
 OBSTETRIC ADMISSION HISTORY AND PHYSICAL  Sally Shannon is a 28 y.o. female G2P1001 with IUP at [redacted]w[redacted]d by 8w US  presenting for IOL for A1GDM and LGA. She reports +FMs, No LOF, no VB, no blurry vision, headaches or peripheral edema, and RUQ pain.  She plans on breast feeding. She request paragard  for birth control. She received her prenatal care at [facility]   Dating: By 8w US  --->  Estimated Date of Delivery: 12/07/23  Sono:    @[redacted]w[redacted]d , CWD, normal anatomy, cephalic presentation, AFI 25.5 MVP 9.9 cm, anterior placenta @[redacted]w[redacted]d  Est. FW: 2835 gm 6 lb 4 oz 95 %   Prenatal History/Complications:  A1GDM Anemia in pregnancy Polyhydramnios HLD Trigger finger L index finger Uterine fibroids  Past Medical History: Past Medical History:  Diagnosis Date   Anemia    Gestational diabetes    HLD (hyperlipidemia)    Vitamin D deficiency     Past Surgical History: Past Surgical History:  Procedure Laterality Date   WISDOM TOOTH EXTRACTION      Obstetrical History: OB History     Gravida  2   Para  1   Term  1   Preterm      AB      Living  1      SAB      IAB      Ectopic      Multiple  0   Live Births  1           Social History Social History   Socioeconomic History   Marital status: Single    Spouse name: Not on file   Number of children: Not on file   Years of education: Not on file   Highest education level: Associate degree: occupational, Scientist, product/process development, or vocational program  Occupational History   Not on file  Tobacco Use   Smoking status: Never   Smokeless tobacco: Never  Vaping Use   Vaping status: Never Used  Substance and Sexual Activity   Alcohol  use: No    Alcohol /week: 0.0 standard drinks of alcohol    Drug use: No   Sexual activity: Yes    Birth control/protection: None  Other Topics Concern   Not on file  Social History Narrative   Not on file   Social Drivers of Health   Financial Resource Strain: Low Risk  (03/15/2023)    Overall Financial Resource Strain (CARDIA)    Difficulty of Paying Living Expenses: Not hard at all  Food Insecurity: No Food Insecurity (11/20/2023)   Hunger Vital Sign    Worried About Running Out of Food in the Last Year: Never true    Ran Out of Food in the Last Year: Never true  Transportation Needs: No Transportation Needs (11/20/2023)   PRAPARE - Administrator, Civil Service (Medical): No    Lack of Transportation (Non-Medical): No  Physical Activity: Sufficiently Active (03/15/2023)   Exercise Vital Sign    Days of Exercise per Week: 5 days    Minutes of Exercise per Session: 30 min  Stress: No Stress Concern Present (03/15/2023)   [employer] of Occupational Health - Occupational Stress Questionnaire    Feeling of Stress : Only a little  Social Connections: Moderately Isolated (11/20/2023)   Social Connection and Isolation Panel    Frequency of Communication with Friends and Family: Three times a week    Frequency of Social Gatherings with Friends and Family: Three times a week    Attends Religious  Services: Never    Database administrator or Organizations: No    Attends Engineer, structural: Never    Marital Status: Married    Family History: Family History  Problem Relation Age of Onset   Anemia Mother    Depression Mother    Diabetes Mother    Anemia Maternal Grandmother     Allergies: Allergies  Allergen Reactions   Pork-Derived Products     Medications Prior to Admission  Medication Sig Dispense Refill Last Dose/Taking   metoCLOPramide  (REGLAN ) 10 MG tablet Take 1 tablet (10 mg total) by mouth every 6 (six) hours. 30 tablet 0 11/19/2023 Morning   Prenatal Vit-Fe Fumarate-FA (PRENATAL MULTIVITAMIN) TABS tablet Take 1 tablet by mouth daily at 12 noon.   Past Month   Accu-Chek Softclix Lancets lancets Check glucose four times a day 100 each 12    Blood Glucose Monitoring Suppl (ACCU-CHEK GUIDE) w/Device KIT 1 kit by Does not apply route  in the morning, at noon, in the evening, and at bedtime. 1 kit 0    cyclobenzaprine  (FLEXERIL ) 10 MG tablet Take 1 tablet (10 mg total) by mouth 3 (three) times daily as needed for muscle spasms. (Patient not taking: Reported on 11/17/2023) 45 tablet 1    famotidine  (PEPCID ) 20 MG tablet Take 1 tablet (20 mg total) by mouth 2 (two) times daily. 60 tablet 4    glucose blood test strip Check blood glucose four times a day 100 each 12    nystatin  (MYCOSTATIN /NYSTOP ) powder Apply 1 Application topically 3 (three) times daily. 15 g 0    pantoprazole  (PROTONIX ) 40 MG tablet Take 1 tablet (40 mg total) by mouth daily. 30 tablet 0    Prenatal Vit-Fe Fumarate-FA (PRENATAL VITAMIN) 27-0.8 MG TABS Take 1 tablet by mouth daily. 30 tablet 12      Review of Systems   All systems reviewed and negative except as stated in HPI  Height 5' 4 (1.626 m), weight 108.8 kg, last menstrual period 02/05/2023. General appearance: alert and cooperative Lungs: clear to auscultation bilaterally Heart: regular rate and rhythm Abdomen: soft, non-tender; bowel sounds normal Extremities: Homans sign is negative, no sign of DVT Presentation: cephalic Fetal monitoringBaseline: 150 bpm, Variability: Good {> 6 bpm), Accelerations: Reactive, and Decelerations: Absent Uterine activity q2-23min Dilation: Closed Effacement (%): Thick Station: -3 Exam by:: Alan Sandifer, RN   Prenatal labs: ABO, Rh: --/--/PENDING (07/21 1813) Antibody: PENDING (07/21 1813) Rubella: 23.20 (01/16 1644) RPR: Non Reactive (06/17 1248)  HBsAg: Negative (01/16 1644)  HIV: Non Reactive (06/17 1248)  GBS: Positive/-- (07/18 1146)    Lab Results  Component Value Date   GBS Positive (A) 11/17/2023   GTT unable to tolerate GTT, fingersticks r/I GDM Genetic screening  NIPS not done, AFP negative, Horizon not done Anatomy US  07/20/2023 normal anatomy, normal AFI, Est. FW: 351 gm 0 lb 12 oz 76 %   Immunization History  Administered Date(s)  Administered   DTaP 06/11/1997, 08/14/1997, 10/14/1997, 07/15/1998, 05/31/2002   HIB (PRP-OMP) 06/11/1997, 08/14/1997, 07/15/1998   HPV Quadrivalent 02/02/2007, 08/14/2008, 03/09/2010   Hepatitis A 02/02/2007, 08/14/2008   Hepatitis B 1997/03/02, 05/08/1997, 01/22/1998   IPV 06/11/1997, 08/14/1997, 10/14/1997, 05/31/2002   Influenza,Quad,Nasal, Live 02/06/2014   Influenza,inj,Quad PF,6+ Mos 02/10/2015, 01/13/2016   Influenza-Unspecified 02/02/2007, 03/09/2010, 07/06/2011, 02/06/2014   MMR 04/15/1998, 05/31/2002   Meningococcal Conjugate 08/14/2008, 10/04/2013   Td 08/14/2008   Tdap 08/14/2008, 03/09/2016   Varicella 04/15/1998, 02/02/2007   Yellow Fever 08/21/2008  Prenatal Transfer Tool  Maternal Diabetes: Yes:  Diabetes Type:  Diet controlled Genetic Screening: Normal Maternal Ultrasounds/Referrals: Normal Fetal Ultrasounds or other Referrals:  Referred to Materal Fetal Medicine  q4w growth scans Maternal Substance Abuse:  No Significant Maternal Medications:  None Significant Maternal Lab Results: Group B Strep positive Number of Prenatal Visits:greater than 3 verified prenatal visits Maternal Vaccinations: none Other Comments:  None   Results for orders placed or performed during the hospital encounter of 11/20/23 (from the past 24 hours)  Type and screen   Collection Time: 11/20/23  6:13 PM  Result Value Ref Range   ABO/RH(D) PENDING    Antibody Screen PENDING    Sample Expiration      11/23/2023,2359 Performed at [facility], [address] [place], [place] [address]   Glucose, capillary   Collection Time: 11/20/23  6:47 PM  Result Value Ref Range   Glucose-Capillary 66 (L) 70 - 99 mg/dL    Patient Active Problem List   Diagnosis Date Noted   GBS (group B Streptococcus carrier), +RV culture, currently pregnant 11/20/2023   GDM (gestational diabetes mellitus) 11/20/2023   Polyhydramnios 11/20/2023   Gestational diabetes, diet controlled 10/31/2023    Supervision of other normal pregnancy, antepartum 04/19/2023   Anemia complicating pregnancy, third trimester    HLD (hyperlipidemia)    Acquired trigger finger of left index finger 01/14/2021    Assessment/Plan:  Hulan Bint Pearl Bents is a 28 y.o. G2P1001 at [redacted]w[redacted]d here for IOL for A1GDM  #Labor: Cytotec  50 mcg buccal. Plan to recheck ~4hrs. Discussed Foley balloon, pitocin . Answered questions. #Pain: Epidural on request #FWB: Cat 1 #GBS status:  positive #Feeding: Breastmilk  #Reproductive Life planning: IUD Paragard  interval #Circ:  not applicable  #A1GDM #LGA Q4h CBG in latent labor, Q2h in active. Measuring 95th%ile on [redacted]w[redacted]d US .  #Polyhydramnios AFI 25.5 on 7/14 US .  #Anemia CBC on admission pending. Last hgb 9.9 on 7/14. Not on oral iron.  #Uterine fibroids Two anterior fibroids with the largest measuring 5.7 cm.   Hobert JAYSON Kasal, MD  11/20/2023, 6:55 PM   Attestation of Supervision of Resident:  I confirm that I have verified the information documented in the resident's note and that I have also personally reperformed the history, physical exam and all medical decision making activities.  I have verified that all services and findings are accurately documented in this resident's note; and I agree with management and plan as outlined in the documentation. I have also made any necessary editorial changes.   Alain Sor, MD OB Fellow, Faculty Practice [facility], Center for [employer]

## 2023-11-20 NOTE — Progress Notes (Signed)
 FB 40mL

## 2023-11-21 ENCOUNTER — Encounter (HOSPITAL_COMMUNITY): Payer: Self-pay | Admitting: Family Medicine

## 2023-11-21 ENCOUNTER — Inpatient Hospital Stay (HOSPITAL_COMMUNITY): Admitting: Anesthesiology

## 2023-11-21 DIAGNOSIS — O24420 Gestational diabetes mellitus in childbirth, diet controlled: Secondary | ICD-10-CM

## 2023-11-21 DIAGNOSIS — O99820 Streptococcus B carrier state complicating pregnancy: Secondary | ICD-10-CM

## 2023-11-21 DIAGNOSIS — O403XX0 Polyhydramnios, third trimester, not applicable or unspecified: Secondary | ICD-10-CM

## 2023-11-21 DIAGNOSIS — O3663X0 Maternal care for excessive fetal growth, third trimester, not applicable or unspecified: Secondary | ICD-10-CM

## 2023-11-21 DIAGNOSIS — Z3A37 37 weeks gestation of pregnancy: Secondary | ICD-10-CM

## 2023-11-21 LAB — GLUCOSE, CAPILLARY
Glucose-Capillary: 85 mg/dL (ref 70–99)
Glucose-Capillary: 76 mg/dL (ref 70–99)
Glucose-Capillary: 107 mg/dL — ABNORMAL HIGH (ref 70–99)

## 2023-11-21 LAB — RPR: RPR Ser Ql: NONREACTIVE

## 2023-11-21 MED ORDER — COCONUT OIL OIL
1.0000 | TOPICAL_OIL | Status: DC | PRN
  Administered 2023-11-22: 1 via TOPICAL

## 2023-11-21 MED ORDER — PHENYLEPHRINE 80 MCG/ML (10ML) SYRINGE FOR IV PUSH (FOR BLOOD PRESSURE SUPPORT)
80.0000 ug | PREFILLED_SYRINGE | INTRAVENOUS | Status: AC | PRN
  Administered 2023-11-21: 80 ug via INTRAVENOUS
  Administered 2023-11-21: 160 ug via INTRAVENOUS

## 2023-11-21 MED ORDER — SODIUM CHLORIDE 0.9% FLUSH
3.0000 mL | INTRAVENOUS | Status: DC | PRN
Start: 2023-11-21 — End: 2023-11-23

## 2023-11-21 MED ORDER — FAMOTIDINE 20 MG PO TABS
40.0000 mg | ORAL_TABLET | Freq: Once | ORAL | Status: AC
  Administered 2023-11-21: 40 mg via ORAL
  Filled 2023-11-21: qty 2

## 2023-11-21 MED ORDER — LACTATED RINGERS IV SOLN: 500.0000 mL | Freq: Once | INTRAVENOUS | Status: DC

## 2023-11-21 MED ORDER — EPHEDRINE 5 MG/ML INJ
10.0000 mg | INTRAVENOUS | Status: DC | PRN
  Filled 2023-11-21: qty 5

## 2023-11-21 MED ORDER — EPHEDRINE 5 MG/ML INJ
10.0000 mg | INTRAVENOUS | Status: DC | PRN
  Administered 2023-11-21: 10 mg via INTRAVENOUS

## 2023-11-21 MED ORDER — PHENYLEPHRINE 80 MCG/ML (10ML) SYRINGE FOR IV PUSH (FOR BLOOD PRESSURE SUPPORT)
80.0000 ug | PREFILLED_SYRINGE | INTRAVENOUS | Status: AC | PRN
  Administered 2023-11-21 (×3): 80 ug via INTRAVENOUS

## 2023-11-21 MED ORDER — PHENYLEPHRINE 80 MCG/ML (10ML) SYRINGE FOR IV PUSH (FOR BLOOD PRESSURE SUPPORT)
PREFILLED_SYRINGE | INTRAVENOUS | Status: AC
  Filled 2023-11-21: qty 10

## 2023-11-21 MED ORDER — PRENATAL MULTIVITAMIN CH
1.0000 | ORAL_TABLET | Freq: Every day | ORAL | Status: DC
  Administered 2023-11-22 – 2023-11-23 (×2): 1 via ORAL
  Filled 2023-11-21 (×2): qty 1

## 2023-11-21 MED ORDER — DIPHENHYDRAMINE HCL 25 MG PO CAPS: 25.0000 mg | ORAL_CAPSULE | Freq: Four times a day (QID) | ORAL | Status: DC | PRN

## 2023-11-21 MED ORDER — FENTANYL-BUPIVACAINE-NACL 0.5-0.125-0.9 MG/250ML-% EP SOLN
12.0000 mL/h | EPIDURAL | Status: DC | PRN
  Administered 2023-11-21: 12 mL/h via EPIDURAL
  Filled 2023-11-21: qty 250

## 2023-11-21 MED ORDER — PHENYLEPHRINE HCL-NACL 20-0.9 MG/250ML-% IV SOLN
0.0000 ug/min | INTRAVENOUS | Status: DC
  Administered 2023-11-21: 20 ug/min via INTRAVENOUS

## 2023-11-21 MED ORDER — LIDOCAINE HCL (PF) 1 % IJ SOLN
INTRAMUSCULAR | Status: DC | PRN
  Administered 2023-11-21 (×2): 4 mL via EPIDURAL

## 2023-11-21 MED ORDER — ONDANSETRON HCL 4 MG PO TABS: 4.0000 mg | ORAL_TABLET | ORAL | Status: DC | PRN

## 2023-11-21 MED ORDER — IBUPROFEN 800 MG PO TABS
800.0000 mg | ORAL_TABLET | Freq: Three times a day (TID) | ORAL | Status: DC
  Administered 2023-11-21 – 2023-11-23 (×6): 800 mg via ORAL
  Filled 2023-11-21 (×6): qty 1

## 2023-11-21 MED ORDER — SENNOSIDES-DOCUSATE SODIUM 8.6-50 MG PO TABS
2.0000 | ORAL_TABLET | ORAL | Status: DC
  Administered 2023-11-21 – 2023-11-22 (×2): 2 via ORAL
  Filled 2023-11-21 (×2): qty 2

## 2023-11-21 MED ORDER — BENZOCAINE-MENTHOL 20-0.5 % EX AERO
1.0000 | INHALATION_SPRAY | CUTANEOUS | Status: DC | PRN
  Administered 2023-11-21: 1 via TOPICAL
  Filled 2023-11-21: qty 56

## 2023-11-21 MED ORDER — TERBUTALINE SULFATE 1 MG/ML IJ SOLN
0.2500 mg | Freq: Once | INTRAMUSCULAR | Status: AC | PRN
  Administered 2023-11-21: 0.25 mg via SUBCUTANEOUS
  Filled 2023-11-21: qty 1

## 2023-11-21 MED ORDER — PHENYLEPHRINE 80 MCG/ML (10ML) SYRINGE FOR IV PUSH (FOR BLOOD PRESSURE SUPPORT)
80.0000 ug | PREFILLED_SYRINGE | INTRAVENOUS | Status: DC | PRN
  Administered 2023-11-21 (×3): 160 ug via INTRAVENOUS

## 2023-11-21 MED ORDER — ACETAMINOPHEN 325 MG PO TABS
650.0000 mg | ORAL_TABLET | ORAL | Status: DC | PRN
  Administered 2023-11-21 – 2023-11-22 (×2): 650 mg via ORAL
  Filled 2023-11-21 (×3): qty 2

## 2023-11-21 MED ORDER — PHENYLEPHRINE 80 MCG/ML (10ML) SYRINGE FOR IV PUSH (FOR BLOOD PRESSURE SUPPORT)
80.0000 ug | PREFILLED_SYRINGE | INTRAVENOUS | Status: AC | PRN
  Administered 2023-11-21 (×2): 80 ug via INTRAVENOUS
  Filled 2023-11-21 (×2): qty 10

## 2023-11-21 MED ORDER — OXYTOCIN-SODIUM CHLORIDE 30-0.9 UT/500ML-% IV SOLN
1.0000 m[IU]/min | INTRAVENOUS | Status: DC
  Administered 2023-11-21 (×2): 2 m[IU]/min via INTRAVENOUS
  Filled 2023-11-21 (×2): qty 500

## 2023-11-21 MED ORDER — AMMONIA AROMATIC IN INHA
RESPIRATORY_TRACT | Status: AC
  Filled 2023-11-21: qty 10

## 2023-11-21 MED ORDER — DIPHENHYDRAMINE HCL 50 MG/ML IJ SOLN: 12.5000 mg | INTRAMUSCULAR | Status: DC | PRN

## 2023-11-21 MED ORDER — PHENYLEPHRINE 80 MCG/ML (10ML) SYRINGE FOR IV PUSH (FOR BLOOD PRESSURE SUPPORT)
PREFILLED_SYRINGE | INTRAVENOUS | Status: AC
  Administered 2023-11-21: 80 ug
  Filled 2023-11-21: qty 10

## 2023-11-21 MED ORDER — DIBUCAINE (PERIANAL) 1 % EX OINT: 1.0000 | TOPICAL_OINTMENT | CUTANEOUS | Status: DC | PRN

## 2023-11-21 MED ORDER — OXYCODONE HCL 5 MG PO TABS
5.0000 mg | ORAL_TABLET | Freq: Four times a day (QID) | ORAL | Status: DC | PRN
  Administered 2023-11-21 – 2023-11-22 (×3): 5 mg via ORAL
  Administered 2023-11-23: 10 mg via ORAL
  Filled 2023-11-21 (×3): qty 1
  Filled 2023-11-21: qty 2

## 2023-11-21 MED ORDER — SIMETHICONE 80 MG PO CHEW: 80.0000 mg | CHEWABLE_TABLET | ORAL | Status: DC | PRN

## 2023-11-21 MED ORDER — SODIUM CHLORIDE 0.9% FLUSH
3.0000 mL | Freq: Two times a day (BID) | INTRAVENOUS | Status: DC
  Administered 2023-11-21: 3 mL via INTRAVENOUS

## 2023-11-21 MED ORDER — PHENYLEPHRINE HCL-NACL 20-0.9 MG/250ML-% IV SOLN: 0.0000 ug/min | INTRAVENOUS | Status: DC

## 2023-11-21 MED ORDER — TETANUS-DIPHTH-ACELL PERTUSSIS 5-2.5-18.5 LF-MCG/0.5 IM SUSY: 0.5000 mL | PREFILLED_SYRINGE | Freq: Once | INTRAMUSCULAR | Status: DC

## 2023-11-21 MED ORDER — WITCH HAZEL-GLYCERIN EX PADS: 1.0000 | MEDICATED_PAD | CUTANEOUS | Status: DC | PRN

## 2023-11-21 MED ORDER — ONDANSETRON HCL 4 MG/2ML IJ SOLN: 4.0000 mg | INTRAMUSCULAR | Status: DC | PRN

## 2023-11-21 MED ORDER — LACTATED RINGERS AMNIOINFUSION: INTRAVENOUS | Status: DC

## 2023-11-21 MED ORDER — SODIUM CHLORIDE 0.9 % IV SOLN: 250.0000 mL | INTRAVENOUS | Status: DC | PRN

## 2023-11-21 MED ORDER — PHENYLEPHRINE HCL-NACL 20-0.9 MG/250ML-% IV SOLN
INTRAVENOUS | Status: AC
  Filled 2023-11-21: qty 250

## 2023-11-21 NOTE — Lactation Note (Signed)
 This note was copied from a baby's chart. Lactation Consultation Note  Patient Name: Sally Shannon Unijb'd Date: 11/21/2023 Age:27 hours Reason for consult: Initial assessment;Early term 37-38.6wks  P2, 37 wks, @ 3 hours of life. Bedside RN called LC into room. Infant on breast. Encouraged starting with hand expression, using breast compression and keeping infant awake @ breast. Mom shares she has D-MER with the last baby- experienced long-term breast feeder. Encouraged mom her milk will come easier with each pregnancy. Hand pump provided.  Discussed expectations @ breast- Day 1- sleepy/ feed every 3 hrs/ even 10 minutes is okay, Day 2 more awake/ feeding cues/longer feeds, and cluster feeding overnights brings milk in. Highlighted breast stimulation is tied directly to milk production. Discussed hands on breast and baby, keeping baby awake @ breast. Starting with hand expression & breast compression to get baby working @ breast, and gentle stimulation to keep baby working @ breast. Encouraged EBM for nipple care post feed. LC services and milk storage shared. Encouraged mom to call for assist anytime desired.  Maternal Data Has patient been taught Hand Expression?: Yes Does the patient have breastfeeding experience prior to this delivery?: Yes  Feeding Mother's Current Feeding Choice: Breast Milk  LATCH Score Latch: Grasps breast easily, tongue down, lips flanged, rhythmical sucking.  Audible Swallowing: Spontaneous and intermittent  Type of Nipple: Everted at rest and after stimulation  Comfort (Breast/Nipple): Soft / non-tender  Hold (Positioning): Assistance needed to correctly position infant at breast and maintain latch.  LATCH Score: 9   Lactation Tools Discussed/Used Tools: Flanges;Pump Breast pump type: Manual Pump Education: Milk Storage;Setup, frequency, and cleaning  Interventions Interventions: Breast feeding basics reviewed;Assisted with latch;Hand express;Breast  compression;Coconut oil;Hand pump;Education;LC Services brochure;CDC milk storage guidelines  Discharge Pump: DEBP;Manual;Personal  Consult Status Consult Status: Follow-up Date: 11/22/23 Follow-up type: In-patient    [facility] 11/21/2023, 6:42 PM

## 2023-11-21 NOTE — Progress Notes (Signed)
 Labor Progress Note Sally Shannon is a 28 y.o. G2P1001 at [redacted]w[redacted]d presented for IOL 2/2 A1GDM  S:  Comfortable s/p epidural   O:  BP 122/63   Pulse (!) 113   Temp 98.6 F (37 C) (Oral)   Resp 18   Ht 5' 4 (1.626 m)   Wt 108.8 kg   LMP 02/05/2023   SpO2 100%   BMI 41.18 kg/m   EFM: baseline 150 bpm/ minimal to moderate variability/ 10x10 accels/ prolonged, lates decels  Toco/IUPC: 2-7 SVE: Dilation: 5 Effacement (%): 80 Station: -2 Presentation: Vertex Exam by:: Sally CNM Pitocin : 0 mu/min  A/P: 28 y.o. G2P1001 [redacted]w[redacted]d IOL  1. Labor: IOL in latent phase s/p cytotec , FB, pitocin . In to evaluate for AROM, patient agrees after discussion of RBA. Copious fluid returned with good progress from 3 to 5 cm dilation, and fetal head well applied to cervix. However, patient became symptomatic from hypotension and required a 4th dose of medication shortly after procedure. Patient had brief syncopal episode followed by emesis. She reported symptoms of ringing/fullness in her ears, difficulty breathing, and visual fields narrowing/darkening just preceding syncopal episode.   Difficulty tracing FHR during above syncopal episode and shortly after. FSE and IUPC placed. Pitocin  turned off, fluid bolus given, terbutaline  given. Will give pitocin  break, allow FHR to recover and restart. Asked RN staff to consult anesthesia for evaluation of epidural rate as possible issue with hypotension and symptoms.   2. FWB: Cat 2 3. Pain: Comfortable s/p epidural  4. GBS pos with treatment   Anticipate NVSB.  Sally Shannon, CNM 7:27 AM

## 2023-11-21 NOTE — Anesthesia Preprocedure Evaluation (Signed)
 Anesthesia Evaluation  Patient identified by MRN, date of birth, ID band Patient awake    Reviewed: Allergy & Precautions, Patient's Chart, lab work & pertinent test results  History of Anesthesia Complications Negative for: history of anesthetic complications  Airway Mallampati: II  TM Distance: >3 FB Neck ROM: Full    Dental no notable dental hx.    Pulmonary neg pulmonary ROS   Pulmonary exam normal        Cardiovascular negative cardio ROS Normal cardiovascular exam     Neuro/Psych negative neurological ROS     GI/Hepatic negative GI ROS, Neg liver ROS,,,  Endo/Other  diabetes, Gestational    Renal/GU negative Renal ROS  negative genitourinary   Musculoskeletal negative musculoskeletal ROS (+)    Abdominal   Peds  Hematology  (+) Blood dyscrasia, anemia   Anesthesia Other Findings Day of surgery medications reviewed with patient.  Reproductive/Obstetrics (+) Pregnancy                              Anesthesia Physical Anesthesia Plan  ASA: 3  Anesthesia Plan: Epidural   Post-op Pain Management:    Induction:   PONV Risk Score and Plan: Treatment may vary due to age or medical condition  Airway Management Planned: Natural Airway  Additional Equipment: Fetal Monitoring  Intra-op Plan:   Post-operative Plan:   Informed Consent: I have reviewed the patients History and Physical, chart, labs and discussed the procedure including the risks, benefits and alternatives for the proposed anesthesia with the patient or authorized representative who has indicated his/her understanding and acceptance.       Plan Discussed with:   Anesthesia Plan Comments:         Anesthesia Quick Evaluation

## 2023-11-21 NOTE — Discharge Summary (Signed)
 Postpartum Discharge Summary  Date of Service updated***     Patient Name: Sally Shannon DOB: [birth year] MRN: [id number]  Date of admission: 11/20/2023 Delivery date:11/21/2023 Delivering provider: VON REASONER Date of discharge: 11/21/2023  Admitting diagnosis: GDM (gestational diabetes mellitus) [O24.419] Intrauterine pregnancy: [redacted]w[redacted]d     Secondary diagnosis:  Principal Problem:   SVD (spontaneous vaginal delivery) Active Problems:   Anemia complicating pregnancy, third trimester   GBS (group B Streptococcus carrier), +RV culture, currently pregnant   GDM (gestational diabetes mellitus)   Polyhydramnios  Additional problems: ***    Discharge diagnosis: Term Pregnancy Delivered and GDM A1                                              Post partum procedures:{Postpartum procedures:23558} Augmentation: AROM, Pitocin , Cytotec , and IP Foley Complications: {OB Labor/Delivery Complications:20784}  Hospital course: Induction of Labor With Vaginal Delivery   28 y.o. yo G2P1001 at [redacted]w[redacted]d was admitted to the hospital 11/20/2023 for induction of labor.  Indication for induction: A1 DM.  Patient had an labor course complicated by persistent hypotension after epidural, for which she required a Phenylephrine  drip during labor. Membrane Rupture Time/Date: 6:49 AM,11/21/2023  Delivery Method:Vaginal, Spontaneous Operative Delivery:N/A Episiotomy: None Lacerations:  None Details of delivery can be found in separate delivery note.  Patient had a postpartum course complicated by***. Patient is discharged home 11/21/23.  Newborn Data: Birth date:11/21/2023 Birth time:3:29 PM Gender:Female Living status:Living Apgars:7 ,9  Weight:3760 g  Magnesium Sulfate received: {Mag received:30440022} BMZ received: No Rhophylac:N/A MMR:N/A T-DaP:*** Flu: No RSV Vaccine received: No Transfusion:{Transfusion received:30440034}  Immunizations received: Immunization History  Administered  Date(s) Administered   DTaP 06/11/1997, 08/14/1997, 10/14/1997, 07/15/1998, 05/31/2002   HIB (PRP-OMP) 06/11/1997, 08/14/1997, 07/15/1998   HPV Quadrivalent 02/02/2007, 08/14/2008, 03/09/2010   Hepatitis A 02/02/2007, 08/14/2008   Hepatitis B Feb 08, 1997, 05/08/1997, 01/22/1998   IPV 06/11/1997, 08/14/1997, 10/14/1997, 05/31/2002   Influenza,Quad,Nasal, Live 02/06/2014   Influenza,inj,Quad PF,6+ Mos 02/10/2015, 01/13/2016   Influenza-Unspecified 02/02/2007, 03/09/2010, 07/06/2011, 02/06/2014   MMR 04/15/1998, 05/31/2002   Meningococcal Conjugate 08/14/2008, 10/04/2013   Td 08/14/2008   Tdap 08/14/2008, 03/09/2016   Varicella 04/15/1998, 02/02/2007   Yellow Fever 08/21/2008    Physical exam  Vitals:   11/21/23 1400 11/21/23 1415 11/21/23 1430 11/21/23 1445  BP: (!) 143/66 125/74 138/81 (!) 127/93  Pulse: (!) 104 96 (!) 101 (!) 134  Resp: 20     Temp:      TempSrc:      SpO2: 99% 100% 100% 100%  Weight:      Height:       General: {Exam; general:21111117} Lochia: {Desc; appropriate/inappropriate:30686::appropriate} Uterine Fundus: {Desc; firm/soft:30687} Incision: {Exam; incision:21111123} DVT Evaluation: {Exam; dvt:2111122} Labs: Lab Results  Component Value Date   WBC 8.8 11/20/2023   HGB 10.7 (L) 11/20/2023   HCT 34.2 (L) 11/20/2023   MCV 72.9 (L) 11/20/2023   PLT 396 11/20/2023      Latest Ref Rng & Units 06/18/2014    6:37 PM  CMP  Glucose 70 - 99 mg/dL 94   BUN 6 - 23 mg/dL 13   Creatinine 9.49 - 1.00 mg/dL 9.34   Sodium 864 - 854 mmol/L 137   Potassium 3.5 - 5.1 mmol/L 4.1   Chloride 96 - 112 mmol/L 107   CO2 19 - 32 mmol/L 26   Calcium  8.4 - 10.5 mg/dL 9.4   Total Protein 6.0 - 8.3 g/dL 7.7   Total Bilirubin 0.3 - 1.2 mg/dL 0.4   Alkaline Phos 47 - 119 U/L 111   AST 0 - 37 U/L 22   ALT 0 - 35 U/L 16    Edinburgh Score:     No data to display         No data recorded  After visit meds:  Allergies as of 11/21/2023       Reactions    Pork-derived Products      Med Rec must be completed prior to using this [facility]***        Discharge home in stable condition Infant Feeding: {Baby feeding:23562} Infant Disposition:{CHL IP OB HOME WITH FNUYZM:76418} Discharge instruction: per After Visit Summary and Postpartum booklet. Activity: Advance as tolerated. Pelvic rest for 6 weeks.  Diet: {OB ipzu:78888878} Future Appointments:No future appointments. Follow up Visit: Message sent to [facility] 7/22  Please schedule this patient for a In person postpartum visit in 6 weeks with the following provider: Any provider. Additional Postpartum F/U:Postpartum Depression checkup and 2 hour GTT  High risk pregnancy complicated by: GDM Delivery mode:  Vaginal, Spontaneous Anticipated Birth Control:  IUD   11/21/2023 Alain Sor, MD

## 2023-11-21 NOTE — Progress Notes (Signed)
 Dilation: 3 Effacement (%): 60 Station: -3, Ballotable Presentation: Vertex Exam by:: Camie Butler Potters, CNM  Not appropriate for AROM due to high station and patient pain. Will get epidural, start pitocin  and AROM when appropriate.   Camie Rote, MSN, CNM, RNC-OB Certified Nurse Midwife, [facility] 11/21/2023 1:13 AM

## 2023-11-21 NOTE — Anesthesia Procedure Notes (Signed)
 Epidural Patient location during procedure: OB Start time: 11/21/2023 2:00 AM End time: 11/21/2023 2:03 AM  Staffing Anesthesiologist: Paul Lamarr BRAVO, MD Performed: anesthesiologist   Preanesthetic Checklist Completed: patient identified, IV checked, risks and benefits discussed, monitors and equipment checked, pre-op evaluation and timeout performed  Epidural Patient position: sitting Prep: DuraPrep and site prepped and draped Patient monitoring: continuous pulse ox, blood pressure and heart rate Approach: midline Location: L3-L4 Injection technique: LOR air  Needle:  Needle type: Tuohy  Needle gauge: 17 G Needle length: 9 cm Needle insertion depth: 7 cm Catheter type: closed end flexible Catheter size: 19 Gauge Catheter at skin depth: 12 cm Test dose: negative and Other (1% lidocaine )  Assessment Events: blood not aspirated, no cerebrospinal fluid, injection not painful, no injection resistance, no paresthesia and negative IV test  Additional Notes Patient identified. Risks, benefits, and alternatives discussed with patient including but not limited to bleeding, infection, nerve damage, paralysis, failed block, incomplete pain control, headache, blood pressure changes, nausea, vomiting, reactions to medication, itching, and postpartum back pain. Confirmed with bedside nurse the patient's most recent platelet count. Confirmed with patient that they are not currently taking any anticoagulation, have any bleeding history, or any family history of bleeding disorders. Patient expressed understanding and wished to proceed. All questions were answered. Sterile technique was used throughout the entire procedure. Please see nursing notes for vital signs.   Crisp LOR after one needle redirection. Test dose was given through epidural catheter and negative prior to continuing to dose epidural or start infusion. Warning signs of high block given to the patient including shortness of breath,  tingling/numbness in hands, complete motor block, or any concerning symptoms with instructions to call for help. Patient was given instructions on fall risk and not to get out of bed. All questions and concerns addressed with instructions to call with any issues or inadequate analgesia.  Reason for block:procedure for pain

## 2023-11-21 NOTE — Progress Notes (Signed)
 Labor Progress Note Sally Shannon is a 28 y.o. G2P1001 at [redacted]w[redacted]d presented for IOL for A1GDM and LGA  S: In to assess pt -- pt had brief LOC, BP cycling and last was 70s/30s. RN at bedside, had just given ephedrine  with improvement in BP. Able to answer questions appropriately. Anesthesia at bedside, rec to continue to maintain BP with PRN pushes of ephedrine . Mentation better on my departure.   O:  BP (!) 143/66   Pulse (!) 104   Temp 98.5 F (36.9 C) (Oral)   Resp 20   Ht 5' 4 (1.626 m)   Wt 108.8 kg   LMP 02/05/2023   SpO2 99%   BMI 41.18 kg/m  EFM: 160/mod/+10x10 a/-d  CVE: Dilation: 5 Effacement (%): 70 Station: -2 Presentation: Vertex Exam by:: S moyer RN   A&P: 28 y.o. G2P1001 [redacted]w[redacted]d here for IOL  #Labor: Progressing slowly  plan to start Pitocin  1x1 when able #Pain: Epidural #FWB: Cat II  #GBS positive, on PCN   #A1GDM: CBGs ok  infant LGA   Alain Sor, MD 2:18 PM

## 2023-11-22 ENCOUNTER — Encounter: Admitting: Family Medicine

## 2023-11-22 LAB — CBC
HCT: 28.5 % — ABNORMAL LOW (ref 36.0–46.0)
Hemoglobin: 8.9 g/dL — ABNORMAL LOW (ref 12.0–15.0)
MCH: 23.1 pg — ABNORMAL LOW (ref 26.0–34.0)
MCHC: 31.2 g/dL (ref 30.0–36.0)
MCV: 74 fL — ABNORMAL LOW (ref 80.0–100.0)
Platelets: 328 K/uL (ref 150–400)
RBC: 3.85 MIL/uL — ABNORMAL LOW (ref 3.87–5.11)
RDW: 15.7 % — ABNORMAL HIGH (ref 11.5–15.5)
WBC: 8.9 K/uL (ref 4.0–10.5)
nRBC: 0 % (ref 0.0–0.2)

## 2023-11-22 LAB — COMPREHENSIVE METABOLIC PANEL WITH GFR
ALT: 12 U/L (ref 0–44)
AST: 22 U/L (ref 15–41)
Albumin: 2.2 g/dL — ABNORMAL LOW (ref 3.5–5.0)
Alkaline Phosphatase: 124 U/L (ref 38–126)
Anion gap: 10 (ref 5–15)
BUN: <5 mg/dL — ABNORMAL LOW (ref 6–20)
CO2: 20 mmol/L — ABNORMAL LOW (ref 22–32)
Calcium: 8.7 mg/dL — ABNORMAL LOW (ref 8.9–10.3)
Chloride: 108 mmol/L (ref 98–111)
Creatinine, Ser: 0.64 mg/dL (ref 0.44–1.00)
GFR, Estimated: >60 mL/min (ref >60)
Glucose, Bld: 117 mg/dL — ABNORMAL HIGH (ref 70–99)
Potassium: 3.5 mmol/L (ref 3.5–5.1)
Sodium: 138 mmol/L (ref 135–145)
Total Bilirubin: 0.5 mg/dL (ref 0.0–1.2)
Total Protein: 5.4 g/dL — ABNORMAL LOW (ref 6.5–8.1)

## 2023-11-22 LAB — GLUCOSE, CAPILLARY: Glucose-Capillary: 70 mg/dL (ref 70–99)

## 2023-11-22 MED ORDER — ALUM & MAG HYDROXIDE-SIMETH 200-200-20 MG/5ML PO SUSP
30.0000 mL | Freq: Once | ORAL | Status: AC
  Administered 2023-11-22: 30 mL via ORAL
  Filled 2023-11-22: qty 30

## 2023-11-22 MED ORDER — LIDOCAINE VISCOUS HCL 2 % MT SOLN
15.0000 mL | Freq: Once | OROMUCOSAL | Status: AC
  Administered 2023-11-22: 15 mL via ORAL
  Filled 2023-11-22: qty 15

## 2023-11-22 MED ORDER — GABAPENTIN 100 MG PO CAPS
200.0000 mg | ORAL_CAPSULE | Freq: Three times a day (TID) | ORAL | Status: DC | PRN
  Administered 2023-11-22: 200 mg via ORAL
  Filled 2023-11-22: qty 2

## 2023-11-22 MED ORDER — PANTOPRAZOLE SODIUM 40 MG PO TBEC
40.0000 mg | DELAYED_RELEASE_TABLET | Freq: Every day | ORAL | Status: DC
  Administered 2023-11-22 – 2023-11-23 (×2): 40 mg via ORAL
  Filled 2023-11-22 (×2): qty 1

## 2023-11-22 NOTE — Progress Notes (Signed)
 POSTPARTUM PROGRESS NOTE  Post Partum Day 1  Subjective:  Sally Shannon is a 28 y.o. [id number] s/p SVD at [redacted]w[redacted]d.  No acute events overnight.  Pt denies problems with ambulating, voiding or po intake.  She denies nausea or vomiting.  Pain is well controlled.  She has had flatus. She has not had bowel movement.  Lochia Moderate. After seeing her, nurse paged regarding LUQ pain.  Objective: Blood pressure 113/68, pulse 88, temperature 98 F (36.7 C), resp. rate 17, height 5' 4 (1.626 m), weight 108.8 kg, last menstrual period 02/05/2023, SpO2 100%, unknown if currently breastfeeding.  Physical Exam:  General: alert, cooperative and no distress Chest: no respiratory distress Heart:regular rate, distal pulses intact Abdomen: soft, nontender,  Uterine Fundus: firm, appropriately tender DVT Evaluation: No calf swelling or tenderness Extremities: mild edema Skin: warm, dry  Recent Labs    11/20/23 1813  HGB 10.7*  HCT 34.2*    Assessment/Plan: Sally Shannon is a 28 y.o. [id number] s/p SVD at [redacted]w[redacted]d   PPD#1 - Doing well -- Continue routine care, lactation support  Contraception: interval Paragard  Feeding: breast Dispo: Plan for discharge 7/24.  #Anxiety #LOC peri-delivery VS WNL. No additional episodes of hypotension/LOC. Asymptomatic. - SW c/s placed  #A1GDM CBG this AM 70.  #LUQ pain - Pantoprazole  and GI cocktail - US  abdomen RUQ 7/16 normal   LOS: 2 days   Deedee Hobert BROCKS, MD 11/22/2023, 6:22 AM

## 2023-11-22 NOTE — Lactation Note (Signed)
 This note was copied from a baby's chart. Lactation Consultation Note  Patient Name: Sally Shannon Unijb'd Date: 11/22/2023 Age:27 hours Reason for consult: Follow-up assessment;Early term 37-38.6wks  P2, 37 wks, @ 19 hrs of life. Mom had requested latch assist- pinching with latch. LC @ support group, after group LC rounds to mom first thing. Discussed baby learning to open big, tips/tricks to elicit big mouth latching. Mom has coconut oil @ bedside, encouraged use post feeds/ with pumping. Requested mom call with next latching- Mom receptive.  Maternal Data Has patient been taught Hand Expression?: Yes Does the patient have breastfeeding experience prior to this delivery?: Yes  Feeding Mother's Current Feeding Choice: Breast Milk   Interventions Interventions: Breast feeding basics reviewed;Education  Discharge Pump: Manual;DEBP;Personal  Consult Status Consult Status: Follow-up Date: 11/22/23 Follow-up type: In-patient    [facility] 11/22/2023, 11:04 AM

## 2023-11-22 NOTE — Anesthesia Postprocedure Evaluation (Signed)
 Anesthesia Post Note  Patient: Sally Shannon  Procedure(s) Performed: AN AD HOC LABOR EPIDURAL     Patient location during evaluation: Mother Baby Anesthesia Type: Epidural Level of consciousness: awake and alert and oriented Pain management: satisfactory to patient Vital Signs Assessment: post-procedure vital signs reviewed and stable Respiratory status: respiratory function stable Cardiovascular status: stable Postop Assessment: no headache, no backache, epidural receding, patient able to bend at knees, no signs of nausea or vomiting, adequate PO intake and able to ambulate Anesthetic complications: no   No notable events documented.  Last Vitals:  Vitals:   11/22/23 0315 11/22/23 0556  BP: 110/67 113/68  Pulse: 100 88  Resp: 18 17  Temp:  36.7 C  SpO2: 100% 100%    Last Pain:  Vitals:   11/22/23 0556  TempSrc:   PainSc: 4    Pain Goal: Patients Stated Pain Goal: 7 (11/21/23 1615)                 PURNELL PORTAL

## 2023-11-22 NOTE — Lactation Note (Signed)
 This note was copied from a baby's chart. Lactation Consultation Note  Patient Name: Sally Shannon Date: 11/22/2023 Age:28 hours Reason for consult: Follow-up assessment;MD order;Early term 37-38.6wks;Maternal endocrine disorder;Breastfeeding assistance  P2- PA requested for LC to consult with this mom and assess a latch because infant has been very jittery and her bili is rising. LC first changed infant's diaper to wake her up. Infant was very noticeably jittery from the moment LC unswaddled her. LC assessed infant's suck and noted a weak suck at first, but after a few minutes it because stronger. MOB then placed infant on the left breast in the football hold. MOB had perfect positioning and latching technique. Infant latched immediately. Infant would suck and then fall asleep. Overall infant was able to nurse for 7 minutes before becoming tired. The RN had previously provided MOB with DBM rescue bottles and formula. LC reviewed how to pace feed infant in a side lying position. Infant took 10 mL with chin support. LC reviewed the importance of offering supplementation after every feeding to ensure proper milk intake to help the glucose and bili. LC encouraged pumping to offer EBM. MOB declined the use of the DEBP and plans to use the manual pump for further stimulation. LC updated RN and PA. LC encouraged MOB to call for further assistance as needed.  Maternal Data Has patient been taught Hand Expression?: Yes Does the patient have breastfeeding experience prior to this delivery?: Yes How long did the patient breastfeed?: 2.5 years per MOB  Feeding Mother's Current Feeding Choice: Breast Milk and Donor Milk Nipple Type: Slow - flow  LATCH Score Latch: Repeated attempts needed to sustain latch, nipple held in mouth throughout feeding, stimulation needed to elicit sucking reflex.  Audible Swallowing: None  Type of Nipple: Everted at rest and after stimulation  Comfort  (Breast/Nipple): Filling, red/small blisters or bruises, mild/mod discomfort (burning sensation when infant nurses)  Hold (Positioning): No assistance needed to correctly position infant at breast.  LATCH Score: 6   Lactation Tools Discussed/Used Tools: Pump;Flanges;Coconut oil Flange Size: 21 Breast pump type: Manual Pump Education: Setup, frequency, and cleaning;Milk Storage Reason for Pumping: DBM use Pumping frequency: 15-20 min every 3 hrs  Interventions Interventions: Breast feeding basics reviewed;Assisted with latch;Hand express;Breast compression;Adjust position;Support pillows;Position options;Coconut oil;Hand pump;Education;Pace feeding;LC Services brochure  Discharge Discharge Education: Engorgement and breast care;Warning signs for feeding baby Pump: DEBP;Manual;Personal  Consult Status Consult Status: Follow-up Date: 11/23/23 Follow-up type: In-patient    Recardo Hoit BS, IBCLC 11/22/2023, 5:49 PM

## 2023-11-22 NOTE — Progress Notes (Signed)
 Post Partum Day 1 Subjective: up ad lib, voiding, tolerating PO, and ongoing severe superficial epigastric burning pain unrelieved by PPI's, percocet, muscle relaxers. Has been worked up and imaged. OP.  Denies rash, N/V, reflux. Does not fel like heartburn.   Multiple syncopat episodes in labor attributed to hypotension from epidural. No dizziness or near-syncope since epidural discontinued. BP elevated in labor but coincides with numerous doses or phenylephrine . Nml since delivery.   Objective: Blood pressure 113/68, pulse 88, temperature 98 F (36.7 C), resp. rate 17, height 5' 4 (1.626 m), weight 108.8 kg, last menstrual period 02/05/2023, SpO2 100%, unknown if currently breastfeeding.  Patient Vitals for the past 24 hrs:  BP Temp Temp src Pulse Resp SpO2  11/22/23 0556 113/68 98 F (36.7 C) -- 88 17 100 %  11/22/23 0315 110/67 -- -- 100 18 100 %  11/21/23 2311 113/77 97.9 F (36.6 C) -- (!) 109 18 100 %  11/21/23 1840 (!) 101/56 99.2 F (37.3 C) -- (!) 101 17 100 %  11/21/23 1754 119/70 99.4 F (37.4 C) Oral 100 16 100 %  11/21/23 1700 118/67 -- -- 97 18 --  11/21/23 1645 119/68 -- -- (!) 108 16 --  11/21/23 1630 125/66 -- -- 99 16 --  11/21/23 1615 128/77 -- -- (!) 109 18 --  11/21/23 1600 112/64 98.3 F (36.8 C) Oral (!) 106 18 --  11/21/23 1545 109/88 -- -- (!) 120 -- --    Physical Exam:  General: alert, cooperative, no distress, and no palor Lochia: appropriate Uterine Fundus: firm Incision: NA DVT Evaluation: No evidence of DVT seen on physical exam.  Recent Labs    11/20/23 1813  HGB 10.7*  HCT 34.2*    Assessment/Plan: Plan for discharge tomorrow, Breastfeeding, Social Work consult, and Contraception OP ParaGard   Hypo and Hypertension in labor. Normotensive PP - CTO BPs closely. No Dx GHTN due to likely being medication related. Will start Lasix for now. - Check CBC, CMET  Syncope in labor--2/2 sever and repeated hypotension. Resolved - Check CBC    Epigastric pain- sounds possibly nerve-related  - Try Gabapentin   - F/U PCP OP   LOS: 2 days   Aalaiyah Yassin  [name] 11/22/2023, 9:34 AM

## 2023-11-22 NOTE — Progress Notes (Signed)
 CSW received a consult due to MOB's significant anxiety during delivery. MOB's anxiety during pregnancy justifies why she could be anxious, or nervous.  Per chart review MOB did not experience anxiety during pregnancy. CSW has checked in with support staff regarding MOB's mental health and they have reported MOB's mental health as stable.  MOB's Edinburgh score 3.  CSW involvement at this time is not required. Please reach out to the CSW if additional needs arise.  CSW identifies no further need for intervention and no barriers to discharge at this time.  Rosina Molt, [place] Clinical Social Worker [phone]

## 2023-11-23 ENCOUNTER — Ambulatory Visit

## 2023-11-23 ENCOUNTER — Other Ambulatory Visit (HOSPITAL_COMMUNITY): Payer: Self-pay

## 2023-11-23 DIAGNOSIS — M792 Neuralgia and neuritis, unspecified: Secondary | ICD-10-CM | POA: Diagnosis present

## 2023-11-23 MED ORDER — IBUPROFEN 800 MG PO TABS
800.0000 mg | ORAL_TABLET | Freq: Three times a day (TID) | ORAL | 1 refills | Status: AC
  Filled 2023-11-23: qty 30, 10d supply, fill #0

## 2023-11-23 MED ORDER — ACETAMINOPHEN 325 MG PO TABS: 650.0000 mg | ORAL_TABLET | ORAL | 0 refills | Status: DC | PRN

## 2023-11-23 MED ORDER — SENNOSIDES-DOCUSATE SODIUM 8.6-50 MG PO TABS: 2.0000 | ORAL_TABLET | ORAL | 0 refills | Status: AC

## 2023-11-23 MED ORDER — IBUPROFEN 800 MG PO TABS: 800.0000 mg | ORAL_TABLET | Freq: Three times a day (TID) | ORAL | 0 refills | Status: DC

## 2023-11-23 MED ORDER — GABAPENTIN 100 MG PO CAPS: 300.0000 mg | ORAL_CAPSULE | Freq: Three times a day (TID) | ORAL | 0 refills | Status: DC | PRN

## 2023-11-23 MED ORDER — GABAPENTIN 300 MG PO CAPS
600.0000 mg | ORAL_CAPSULE | Freq: Three times a day (TID) | ORAL | 1 refills | Status: AC | PRN
  Filled 2023-11-23: qty 60, 10d supply, fill #0

## 2023-11-23 MED ORDER — FERROUS SULFATE 325 (65 FE) MG PO TABS
325.0000 mg | ORAL_TABLET | ORAL | 0 refills | Status: AC
  Filled 2023-11-23: qty 15, 30d supply, fill #0

## 2023-11-23 MED ORDER — GABAPENTIN 100 MG PO CAPS
300.0000 mg | ORAL_CAPSULE | Freq: Three times a day (TID) | ORAL | Status: DC
  Administered 2023-11-23: 300 mg via ORAL
  Filled 2023-11-23: qty 3

## 2023-11-23 NOTE — Lactation Note (Signed)
 This note was copied from a baby's chart.  NICU Lactation Consultation Note  Patient Name: Sally Shannon Unijb'd Date: 11/23/2023 Age:28 years  Reason for consult: Follow-up assessment; NICU baby; Early term 34-38.6wks; Maternal endocrine disorder; Maternal discharge Type of Endocrine Disorder?: Diabetes (GDM)  SUBJECTIVE  LC in to visit with P2 Mom of ET infant delivered vaginally.  Mom admitted to NICU for hypoglycemia at 36 hrs of life.  Baby currently being bottle/gavage fed.  Mom to be discharged from [facility] today and she plans to stay with baby.  LC assisted Mom to hold baby STS while reclined. LC assisted Mom to pump with pumping band as Mom admits to be inconsistent with her pumping.    Mom plans to breastfeed at the next feeding.  Mom encouraged to continue pumping after breastfeeding until baby is vigorous and not being supplemented with EBM+/DBM.  LC moved all the pump parts from Mom's room and set up a washing and a drying basin for care of her pump parts, which LC reviewed.  Mom sized with 18 mm flanges and Mom reports that pumping is more comfortable on her nipples.  Mom has breast milk labels and knows to store all her EBM in the frig.  Mom encouraged to ask for assistance prn. OBJECTIVE Infant data: Mother's Current Feeding Choice: Breast Milk and Donor Milk  O2 Device: Room Air  Infant feeding assessment IDFTS - Readiness: 2 IDFTS - Quality: 2   Maternal data: [id number] Vaginal, Spontaneous Has patient been taught Hand Expression?: Yes Hand Expression Comments: colostrum noted bilaterally Significant Breast History:: ++ breast changes Current breast feeding challenges:: admission to NICU Previous breastfeeding challenges?: Other (Comment) (Mom had an oversupply of milk) Does the patient have breastfeeding experience prior to this delivery?: Yes How long did the patient breastfeed?: 2.5 years per MOB Pumping frequency: not consistent, Mom will either breastfeed  and pump, or pump at each feeding Pumped volume: 4 mL Flange Size: 18 Hands-free pumping top sizes: Large Alejos) Risk factor for low/delayed milk supply:: Infant separation  WIC Program: No WIC Referral Sent?: No Pump: Personal, DEBP (Spectra )  ASSESSMENT Infant:  Feeding Status: Scheduled 9-12-3-6 Feeding method: Bottle; Tube/Gavage (Bolus) Nipple Type: Nfant Slow Flow (purple)  Maternal: Milk volume: Normal  INTERVENTIONS/PLAN Interventions: Interventions: Breast feeding basics reviewed; Skin to skin; Breast massage; Hand express; DEBP; Hand pump; Education Discharge Education: Engorgement and breast care; Outpatient recommendation Tools: Pump; Flanges; Hands-free pumping top Pump Education: Setup, frequency, and cleaning; Milk Storage  Plan: Consult Status: NICU follow-up NICU Follow-up type: Verify onset of copious milk; Verify absence of engorgement   Claudene Aleck BRAVO 11/23/2023, 11:29 AM

## 2023-11-24 NOTE — Lactation Note (Signed)
 This note was copied from a baby's chart. Lactation Consultation Note  Patient Name: Sally Shannon Unijb'd Date: 11/24/2023 Age:27 hours Reason for consult: Follow-up assessment;Engorgement  NICU baby ready for discharge, 37 wks, @ 70 hrs of life. RN call to [facility] to come to NICU 324 to see mom- sh's very  engorged, Gave mom written directions to reference after discharge. Provided coconut oil and re-usable ice pack.   Breast feed or pump every 2-3 hours to keep breast soft. Discussed hand pump when feeling like breast stuck- swollen/ no milk coming out  Tylenol  and motrin  use Ice packs after feeding/pumping Do not use heat, do not massage- breast swollen/inflamed Demonstrated/encouraged reverse pressure exercises LC services and milk storage shared with mom- encouraged calling back with questions or scheduling with out patient if continued issues Discussed probiotics and movement of milk to minimize risk of plugged ducts/ mastitis    Feeding Mother's Current Feeding Choice: Breast Milk Nipple Type: Dr. Jonna Preemie (wide base)   Lactation Tools Discussed/Used Breast pump type: Double-Electric Breast Pump;Manual Pump Education: Milk Storage Pumping frequency: Every 2-3 hours  Interventions Interventions: Breast feeding basics reviewed;Hand express;Breast compression;Reverse pressure;Coconut oil;Expressed milk;Hand pump;Education;LC Services brochure;CDC milk storage guidelines  Discharge Discharge Education: Engorgement and breast care Pump: Manual;DEBP;Personal  Consult Status Consult Status: Complete Date: 11/24/23 Follow-up type: In-patient    Marlisa Caridi 11/24/2023, 2:12 PM

## 2023-11-28 NOTE — Telephone Encounter (Signed)
 Patient delivered 11/21/23.

## 2023-11-29 ENCOUNTER — Encounter: Admitting: Family Medicine

## 2023-12-07 ENCOUNTER — Telehealth (HOSPITAL_COMMUNITY): Payer: Self-pay | Admitting: *Deleted

## 2023-12-07 NOTE — Telephone Encounter (Signed)
 12/07/2023  Name: Sally Shannon MRN: [id number] DOB: 07/12/1995  Reason for Call:  Transition of Care Hospital Discharge Call  Contact Status: Patient Contact Status: Complete  Language assistant needed:          Follow-Up Questions: Do You Have Any Concerns About Your Health As You Heal From Delivery?: Yes What Concerns Do You Have About Your Health?: I'm having some pain in my vagina for the last day or so. It's hard to describe. Patient went on to tell RN that she experiences the pain as she urinates. Denied symptoms of UTI. RN suggested patient try diluting her urine with the peribottle to see if that provides relief. Patient verbalized understanding. RN instructed patient to call MD with new or worsening pain. Patient voiced no other questions or concerns at this time. Do You Have Any Concerns About Your Infants Health?: Yes What Concerns Do You Have About Your Baby?: She is really fussy after she eats. It takes me a couple of hours to settle her back down. But it's only during the day. She doesn't do that at night, and she just started doing it. It wasn't like that at the hospital. RN discussed reviewing patient's intake for a 24 hour period to look for trends in what she is eating that may be causing infant's discomfort. Also discussed keeping infant's head elevated at least 45degrees for after a feeding to help if symptoms are reflux related. Instructed patient to contact pediatrician if symptoms persist. Patient verbalized understanding. No other questions or concerns voiced at this time.  Edinburgh Postnatal Depression Scale:  In the Past 7 Days:   Patient reported that she completed the EPDS with a home health nurse 2 days ago. Reported that her score was a 4. EPDS not repeated at this time.  PHQ2-9 Depression Scale:     Discharge Follow-up: Edinburgh score requires follow up?: N/A Patient was advised of the following resources:: Breastfeeding Support Group,  Support Group  Post-discharge interventions: Reviewed Newborn Safe Sleep Practices  Signature Allean IVAR Carton, RN, 12/07/23, [phone]

## 2024-01-02 ENCOUNTER — Other Ambulatory Visit: Payer: Self-pay

## 2024-01-02 ENCOUNTER — Encounter: Payer: Self-pay | Admitting: Obstetrics and Gynecology

## 2024-01-02 ENCOUNTER — Ambulatory Visit: Admitting: Obstetrics and Gynecology

## 2024-01-02 ENCOUNTER — Other Ambulatory Visit (HOSPITAL_COMMUNITY)
Admission: RE | Admit: 2024-01-02 | Discharge: 2024-01-02 | Disposition: A | Discharge status: Home / Self Care | Source: Ambulatory Visit | Attending: Obstetrics and Gynecology | Admitting: Obstetrics and Gynecology

## 2024-01-02 DIAGNOSIS — Z30430 Encounter for insertion of intrauterine contraceptive device: Secondary | ICD-10-CM | POA: Diagnosis not present

## 2024-01-02 DIAGNOSIS — R87610 Atypical squamous cells of undetermined significance on cytologic smear of cervix (ASC-US): Secondary | ICD-10-CM | POA: Diagnosis not present

## 2024-01-02 DIAGNOSIS — Z8632 Personal history of gestational diabetes: Secondary | ICD-10-CM

## 2024-01-02 DIAGNOSIS — Z124 Encounter for screening for malignant neoplasm of cervix: Secondary | ICD-10-CM

## 2024-01-02 DIAGNOSIS — Z01411 Encounter for gynecological examination (general) (routine) with abnormal findings: Secondary | ICD-10-CM | POA: Diagnosis not present

## 2024-01-02 DIAGNOSIS — Z1151 Encounter for screening for human papillomavirus (HPV): Secondary | ICD-10-CM | POA: Insufficient documentation

## 2024-01-02 DIAGNOSIS — Z3202 Encounter for pregnancy test, result negative: Secondary | ICD-10-CM | POA: Diagnosis not present

## 2024-01-02 LAB — POCT PREGNANCY, URINE: Preg Test, Ur: NEGATIVE

## 2024-01-02 MED ORDER — PARAGARD INTRAUTERINE COPPER IU IUD
1.0000 | INTRAUTERINE_SYSTEM | Freq: Once | INTRAUTERINE | Status: AC
  Administered 2024-01-02: 1 via INTRAUTERINE

## 2024-01-02 NOTE — Progress Notes (Signed)
 Post Partum Visit Note  Sally Shannon is a 28 y.o. G24P2002 female who presents for a postpartum visit. She is 6 weeks postpartum following a normal spontaneous vaginal delivery.  I have fully reviewed the prenatal and intrapartum course. The delivery was at 37.5 gestational weeks.  Anesthesia: epidural. Postpartum course has been well. Baby is doing well. Baby is feeding by breast. Bleeding no bleeding. Bowel function is normal. Bladder function is normal. Patient is not sexually active. Contraception method is IUD. Postpartum depression screening: negative.   Upstream - 01/02/24 1413       Pregnancy Intention Screening   Does the patient want to become pregnant in the next year? No    Does the patient's partner want to become pregnant in the next year? Yes    Would the patient like to discuss contraceptive options today? Yes         The pregnancy intention screening data noted above was reviewed. Potential methods of contraception were discussed. The patient elected to proceed with No data recorded.   Edinburgh Postnatal Depression Scale - 01/02/24 1410       Edinburgh Postnatal Depression Scale:  In the Past 7 Days   I have been able to laugh and see the funny side of things. 0    I have looked forward with enjoyment to things. 0    I have blamed myself unnecessarily when things went wrong. 1    I have been anxious or worried for no good reason. 3    I have felt scared or panicky for no good reason. 3    Things have been getting on top of me. 1    I have been so unhappy that I have had difficulty sleeping. 0    I have felt sad or miserable. 0    I have been so unhappy that I have been crying. 1    The thought of harming myself has occurred to me. 0    Edinburgh Postnatal Depression Scale Total 9          Health Maintenance Due  Topic Date Due   FOOT EXAM  Never done   OPHTHALMOLOGY EXAM  Never done   Diabetic kidney evaluation - Urine ACR  Never done    Pneumococcal Vaccine (1 of 2 - PCV) Never done   Cervical Cancer Screening (Pap smear)  08/27/2023   HEMOGLOBIN A1C  11/15/2023   INFLUENZA VACCINE  12/01/2023   COVID-19 Vaccine (1 - 2024-25 season) Never done    The following portions of the patient's history were reviewed and updated as appropriate: allergies, current medications, past family history, past medical history, past social history, past surgical history, and problem list.  Review of Systems Pertinent items are noted in HPI.  Objective:  BP 104/76   Pulse 93   Wt 213 lb (96.6 kg)   LMP 02/05/2023   Breastfeeding Yes   BMI 36.56 kg/m    General:  alert and cooperative   Breasts:  not indicated  Lungs: Normal effort     Abdomen: nondistended      GU exam: Normal, see IUD insertion above    IUD Insertion Procedure Note Patient identified, informed consent performed, consent signed.   Discussed risks of irregular bleeding, cramping, infection, malpositioning or misplacement of the IUD outside the uterus which may require further procedure such as laparoscopy. Time out was performed.  Urine pregnancy test negative.  Speculum placed in the vagina.  Cervix visualized.  Cleaned with Betadine x 2.  Uterus sounded to 6 cm.  Paragard  IUD placed per manufacturer's recommendations.  Strings trimmed to 3 cm.Patient tolerated procedure well.   Patient was given post-procedure instructions.  She was advised to have backup contraception for one week.  Patient was also asked to check IUD strings periodically and follow up in 4 weeks for IUD check.   Assessment:   1. Postpartum exam (Primary)  - Cytology - PAP( [place])  2. Encounter for IUD insertion  - Pregnancy, urine POC - paragard  intrauterine copper  IUD 1 each  3. Cervical cancer screening  - Cytology - PAP( [place])   4. History of gestational diabetes Plan repeat GTT pp    Plan:   Essential components of care per ACOG recommendations:  1.   Mood and well being: Patient with negative depression screening today. Reviewed local resources for support.  - Patient tobacco use? No.   - hx of drug use? No.    2. Infant care and feeding:  -Patient currently breastmilk feeding? Yes. Reviewed importance of draining breast regularly to support lactation.  -Social determinants of health (SDOH) reviewed in EPIC. No concerns  3. Sexuality, contraception and birth spacing - Patient does not want a pregnancy in the next year.   - Reviewed reproductive life planning. Reviewed contraceptive methods based on pt preferences and effectiveness.  Patient desired IUD or IUS today.   - Discussed birth spacing of 18 months  4. Sleep and fatigue -Encouraged family/partner/community support of 4 hrs of uninterrupted sleep to help with mood and fatigue  5. Physical Recovery  - Discussed patients delivery and complications. She describes her labor as good. - Patient had a Vaginal, no problems at delivery. Patient had a none laceration. Perineal healing reviewed. Patient expressed understanding - Patient has urinary incontinence? No. - Patient is safe to resume physical and sexual activity  6.  Health Maintenance - HM due items addressed Yes - Last pap smear  Diagnosis  Date Value Ref Range Status  08/26/2020   Final   - Negative for intraepithelial lesion or malignancy (NILM)   Pap smear done at today's visit.  -Breast Cancer screening indicated? No.   7. Chronic Disease/Pregnancy Condition follow up: Gestational Diabetes  - PCP follow up   Nidia Daring, FNP [facility], [facility]

## 2024-01-02 NOTE — Patient Instructions (Signed)

## 2024-01-09 LAB — CYTOLOGY - PAP
Comment: NEGATIVE
Diagnosis: UNDETERMINED — AB
High risk HPV: NEGATIVE

## 2024-01-14 ENCOUNTER — Ambulatory Visit: Payer: Self-pay | Admitting: Obstetrics and Gynecology

## 2024-01-16 ENCOUNTER — Ambulatory Visit (INDEPENDENT_AMBULATORY_CARE_PROVIDER_SITE_OTHER): Admitting: Certified Nurse Midwife

## 2024-01-16 ENCOUNTER — Other Ambulatory Visit: Payer: Self-pay

## 2024-01-16 ENCOUNTER — Encounter: Payer: Self-pay | Admitting: Certified Nurse Midwife

## 2024-01-16 ENCOUNTER — Other Ambulatory Visit (HOSPITAL_COMMUNITY)
Admission: RE | Admit: 2024-01-16 | Discharge: 2024-01-16 | Disposition: A | Discharge status: Home / Self Care | Source: Ambulatory Visit | Attending: Certified Nurse Midwife | Admitting: Certified Nurse Midwife

## 2024-01-16 VITALS — BP 110/77 | HR 93 | Ht 64.0 in | Wt 215.0 lb

## 2024-01-16 DIAGNOSIS — N898 Other specified noninflammatory disorders of vagina: Secondary | ICD-10-CM

## 2024-01-16 DIAGNOSIS — Z30431 Encounter for routine checking of intrauterine contraceptive device: Secondary | ICD-10-CM | POA: Diagnosis not present

## 2024-01-16 DIAGNOSIS — N941 Unspecified dyspareunia: Secondary | ICD-10-CM | POA: Diagnosis not present

## 2024-01-16 NOTE — Progress Notes (Signed)
   GYNECOLOGY PROGRESS NOTE  History:  28 y.o. [id number] presents to [facility] office today for problem gyn visit. She reports increased vaginal discharge that she describes as yellow in color. Pain with intercourse, she reports it hurts on the outside and like a line of pain like rubbing internally.   She reports that she can feel the IUD and it is causing discomfort.  She states that she had an IUD before and it never bothered her like this. She denies h/a, dizziness, shortness of breath, n/v, or fever/chills.    The following portions of the patient's history were reviewed and updated as appropriate: allergies, current medications, past family history, past medical history, past social history, past surgical history and problem list. Last pap smear on 2025 was ASCUS, negative HRHPV.  Health Maintenance Due  Topic Date Due   FOOT EXAM  Never done   OPHTHALMOLOGY EXAM  Never done   Diabetic kidney evaluation - Urine ACR  Never done   Pneumococcal Vaccine (1 of 2 - PCV) Never done   HEMOGLOBIN A1C  11/15/2023   Influenza Vaccine  12/01/2023   COVID-19 Vaccine (1 - 2024-25 season) Never done     Review of Systems:  Pertinent items are noted in HPI.   Objective:  Physical Exam Blood pressure 110/77, pulse 93, height 5' 4 (1.626 m), weight 215 lb (97.5 kg), currently breastfeeding. VS reviewed, nursing note reviewed,  Constitutional: well developed, well nourished, no distress HEENT: normocephalic CV: normal rate Pulm/chest wall: normal effort Breast Exam: deferred Abdomen: soft Neuro: alert and oriented x 3 Skin: warm, dry Psych: affect normal Pelvic exam: Cervix pink, visually closed, without lesion, moderate yellow discharge noted coming from cervix, vaginal walls and external genitalia normal. IUD strings noted to be present and an appropriate length.  Bimanual exam: Cervix 0/long/high, firm, anterior, neg CMT, uterus nontender, nonenlarged, adnexa without tenderness, enlargement, or  mass  Assessment & Plan:  1. Vaginal discharge (Primary) - Cervicovaginal ancillary only( [place])  2. IUD check up - Strings appropriate length - US  ordered to ensure in proper place due to patient report of discomfort  3. Dyspareunia in female - Vaginal swabs today for check for infection contributory to dyspareunia - Pelvic floor therapy referral.  - IUD US    Return in about 1 year (around 01/15/2025), or if symptoms worsen or fail to improve, for Annual.   Camie DELENA Rote, CNM 1:07 PM

## 2024-01-17 ENCOUNTER — Ambulatory Visit: Payer: Self-pay | Admitting: Certified Nurse Midwife

## 2024-01-17 LAB — CERVICOVAGINAL ANCILLARY ONLY
Bacterial Vaginitis (gardnerella): NEGATIVE
Candida Glabrata: NEGATIVE
Candida Vaginitis: NEGATIVE
Chlamydia: NEGATIVE
Comment: NEGATIVE
Comment: NEGATIVE
Comment: NEGATIVE
Comment: NEGATIVE
Comment: NEGATIVE
Comment: NORMAL
Neisseria Gonorrhea: NEGATIVE
Trichomonas: NEGATIVE

## 2024-01-23 ENCOUNTER — Encounter: Payer: Self-pay | Admitting: Physical Therapy

## 2024-01-23 ENCOUNTER — Other Ambulatory Visit: Payer: Self-pay

## 2024-01-23 ENCOUNTER — Ambulatory Visit: Attending: Certified Nurse Midwife | Admitting: Physical Therapy

## 2024-01-23 DIAGNOSIS — M6281 Muscle weakness (generalized): Secondary | ICD-10-CM | POA: Insufficient documentation

## 2024-01-23 DIAGNOSIS — M5459 Other low back pain: Secondary | ICD-10-CM | POA: Diagnosis present

## 2024-01-23 DIAGNOSIS — M62838 Other muscle spasm: Secondary | ICD-10-CM | POA: Insufficient documentation

## 2024-01-23 DIAGNOSIS — R293 Abnormal posture: Secondary | ICD-10-CM | POA: Insufficient documentation

## 2024-01-23 DIAGNOSIS — R279 Unspecified lack of coordination: Secondary | ICD-10-CM | POA: Diagnosis present

## 2024-01-23 DIAGNOSIS — N941 Unspecified dyspareunia: Secondary | ICD-10-CM | POA: Diagnosis not present

## 2024-01-23 NOTE — Therapy (Addendum)
 OUTPATIENT PHYSICAL THERAPY FEMALE PELVIC EVALUATION   Patient Name: Sally Shannon MRN: [id number] DOB:November 02, 1995, 28 y.o., female Today's Date: 01/23/2024  END OF SESSION:  PT End of Session - 01/23/24 1234     Visit Number 1    Date for Recertification  07/22/24    Authorization Type [employer] and [employer] (needs auth)    PT Start Time 1231    PT Stop Time 1312    PT Time Calculation (min) 41 min    Activity Tolerance Patient tolerated treatment well;No increased pain    Behavior During Therapy [facility] for tasks assessed/performed          Past Medical History:  Diagnosis Date   Anemia    Gestational diabetes    HLD (hyperlipidemia)    Polyhydramnios 11/20/2023   Vitamin D deficiency    Past Surgical History:  Procedure Laterality Date   WISDOM TOOTH EXTRACTION     Patient Active Problem List   Diagnosis Date Noted   Encounter for IUD insertion 01/02/2024   Neuralgia of abdomen 11/23/2023   History of gestational diabetes 10/31/2023   HLD (hyperlipidemia)    Acquired trigger finger of left index finger 01/14/2021    PCP: Norleen Lynwood ORN, MD   REFERRING PROVIDER: Regino Camie LABOR, CNM  REFERRING DIAG: N94.10 (ICD-10-CM) - Dyspareunia in female  THERAPY DIAG:  Other low back pain  Muscle weakness (generalized)  Abnormal posture  Unspecified lack of coordination  Other muscle spasm  Rationale for Evaluation and Treatment: Rehabilitation  ONSET DATE: 8 weeks ago  SUBJECTIVE:                                                                                                                                                                                           SUBJECTIVE STATEMENT: Reports she has dryness, irritation, almost swollen after intercourse. Is 8 weeks postpartum (first and oldest is 7), is lactating. Low back and neck pain with pregnancy and posture  Fluid intake: several water bottles per day  FUNCTIONAL LIMITATIONS: breastfeeding  positions, intercourse  PERTINENT HISTORY:  Medications for current condition: no Surgeries: no Other: no Sexual abuse: No  DIAGNOSTIC FINDINGS:   PAIN:  Are you having pain? Yes NPRS scale: 4/10 Pain location: neck and back pain  Pain type: aching Pain description: constant   Aggravating factors: breastfeeding, carrying baby (13#) Relieving factors: rest  PRECAUTIONS: Other: postpartum  RED FLAGS: None   WEIGHT BEARING RESTRICTIONS: No  FALLS:  Has patient fallen in last 6 months? 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   ACTIVITY LEVEL : low  PLOF: Independent  PATIENT GOALS: to have no pain and be able to carry baby for errands, and have no pain with intercourse    BOWEL MOVEMENT: Pain with bowel movement: Yes reports feels like ripping with bowel movements Type of bowel movement:Type (Bristol Stool Scale) 1, Frequency every 3 days, and Strain yes Fully empty rectum: No Leakage: No          Pads: No Fiber supplement/laxative softeners   URINATION: Pain with urination: No Fully empty bladder: Yes:                           Stream: Strong Urgency: No Frequency:during the day not quicker than every 2 hours                                                          Nocturia: No   Leakage: none Pads/briefs: No  INTERCOURSE:  Ability to have vaginal penetration Yes  Pain with intercourse: After Intercourse Dryness: not during but after Climax: not painful Marinoff Scale: 1/3 Lubricant: no  PREGNANCY: Vaginal deliveries 2 Tearing Yes: with both (stitches with first, small tear with second) Episiotomy No C-section deliveries 0 Currently pregnant No  PROLAPSE: None   OBJECTIVE:  Note: Objective measures were completed at Evaluation unless otherwise noted.  DIAGNOSTIC FINDINGS:    PATIENT SURVEYS:    PFIQ-7: 48 (pelvis and bowels)  COGNITION: Overall cognitive status: Within functional limits for tasks  assessed     SENSATION: Light touch: Appears intact  LUMBAR SPECIAL TESTS:  SI Compression/distraction test: Negative  FUNCTIONAL TESTS:   Single leg stance: mild pelvic rotation   Rt:hip drop  Lt:hip drop Sit-up test:1/3 Squat: Bed mobility:  GAIT: WFL  POSTURE: rounded shoulders, forward head, decreased lumbar lordosis, increased thoracic kyphosis, and posterior pelvic tilt   LUMBARAROM/PROM:  A/PROM A/PROM  Eval (% available)  Flexion 75  Extension 75  Right lateral flexion 75  Left lateral flexion 75  Right rotation 75  Left rotation 75   (Blank rows = not tested)  LOWER EXTREMITY ROM:  Bil hamstrings and adductors, hip ER and hip IR limited by 25%  LOWER EXTREMITY MMT:  Bil hips grossly 4/5 PALPATION:  General: tightness in bil piriformis, bil lumbar and thoracic paraspinals   Pelvic Alignment: WFL  Abdominal: no TTP  Diastasis: Yes:  is 8 weeks postpartum but does demonstrate separation at DRA 1-2 finger width above umbilicus  Distortion: Yes   Breathing: chest Scar tissue: Yes:  possible scar tissue at external pelvic floor (vulva), pt reports she was told she had mild tearing here with labor but didn't need stitches but did have TTP at Rt side of clitoral region and Rt labia                 External Perineal Exam: TTP at perineal body restriction noted in downward direction. Also of note - pt does have thin white - yellow tinted discharge present, reports she has been tested recently at GYN for this and everything was (-) Per chart review 01/16/24 [place] Last pap smear on 2025 was ASCUS, negative HRHPV.                              Internal Pelvic  Floor: TTP at all directions in superficial layers and tension noted, also present in bil deep layer but less TTP  Patient confirms identification and approves PT to assess internal pelvic floor and treatment Yes No emotional/communication barriers or cognitive limitation. Patient is motivated to  learn. Patient understands and agrees with treatment goals and plan. PT explains patient will be examined in standing, sitting, and lying down to see how their muscles and joints work. When they are ready, they will be asked to remove their underwear so PT can examine their perineum. The patient is also given the option of providing their own chaperone as one is not provided in our facility. The patient also has the right and is explained the right to defer or refuse any part of the evaluation or treatment including the internal exam. With the patient's consent, PT will use one gloved finger to gently assess the muscles of the pelvic floor, seeing how well it contracts and relaxes and if there is muscle symmetry. After, the patient will get dressed and PT and patient will discuss exam findings and plan of care. PT and patient discuss plan of care, schedule, attendance policy and HEP activities.  PELVIC MMT:   MMT eval  Vaginal 3/5, 10, 6 reps  Internal Anal Sphincter   External Anal Sphincter   Puborectalis   Diastasis Recti See above  (Blank rows = not tested)        TONE: WFL  PROLAPSE: Not seen in hooklying with cough   TODAY'S TREATMENT:                                                                                                                              DATE:   01/23/24 EVAL Examination completed, findings reviewed, pt educated on POC, HEP, and voiding habits. Pt motivated to participate in PT and agreeable to attempt recommendations.     PATIENT EDUCATION:  Education details: [id number] Person educated: Patient Education method: Explanation, Demonstration, Tactile cues, Verbal cues, and Handouts Education comprehension: verbalized understanding, returned demonstration, verbal cues required, tactile cues required, and needs further education  HOME EXERCISE PROGRAM: [id number]  ASSESSMENT:  CLINICAL IMPRESSION: Patient is a 28 y.o. female  who was seen today for physical  therapy evaluation and treatment for pain with intercourse postpartum. Pt is currently 8 weeks postpartum with second baby and feeling weaker, more painful this time and experiencing constipation. Pt reports also feeling a little weaker in hips and core,  and demonstrates impaired posture reporting pain in neck and back with carrying baby and breastfeeding. Pt found to have decreased core and hip strength, decreased flexibility in spine and hips. Patient consented to internal pelvic floor assessment vaginally this date and found to have decreased strength, endurance, and coordination and TTP at vaginal opening, internally bil superficial and deep layers. Pt would benefit from additional PT to further address deficits.   OBJECTIVE IMPAIRMENTS: decreased activity tolerance, decreased coordination, decreased endurance, decreased mobility,  decreased strength, increased fascial restrictions, impaired perceived functional ability, increased muscle spasms, impaired flexibility, improper body mechanics, postural dysfunction, and pain.   ACTIVITY LIMITATIONS: carrying, lifting, bending, standing, squatting, continence, locomotion level, and caring for others  PARTICIPATION LIMITATIONS: interpersonal relationship, community activity, and child care  PERSONAL FACTORS: Past/current experiences, Time since onset of injury/illness/exacerbation, and 1 comorbidity: medical history  are also affecting patient's functional outcome.   REHAB POTENTIAL: Good  CLINICAL DECISION MAKING: Stable/uncomplicated  EVALUATION COMPLEXITY: Low   GOALS: Goals reviewed with patient? Yes  SHORT TERM GOALS: Target date: 02/20/24  Pt to be I with HEP for carry over and continuing recommendations for improved outcomes.   Baseline: Goal status: INITIAL  2.  Pt will be independent with use of squatty potty, relaxed toileting mechanics, and improved bowel movement techniques in order to increase ease of bowel movements and complete  evacuation.   Baseline:  Goal status: INITIAL  3.  Pt to demonstrate improved coordination of pelvic floor and breathing mechanics with 10# squat with appropriate synergistic patterns to decrease pain and leakage at least 50% of the time for improved ability to complete a 30 minute walk without strain at pelvic floor and symptoms.    Baseline:  Goal status: INITIAL  4.  Pt to consistently demonstrate transverse abdominis activation with breathing mechanics for improved pelvic stability to carrying baby.  Baseline:  Goal status: INITIAL   LONG TERM GOALS: Target date: 07/23/23  Pt to be I with advanced HEP for carry over and continuing recommendations for improved outcomes.   Baseline:  Goal status: INITIAL  2.  Pt to demonstrate improved coordination of pelvic floor and breathing mechanics with 20# squat with appropriate synergistic patterns to decrease pain and leakage at least 75% of the time for improved ability to complete a 30 minute workout without strain at pelvic floor and symptoms.    Baseline:  Goal status: INITIAL  3.  Pt will report her bowel movements  are complete at least 4x weekly due to improved bowel habits and evacuation techniques.  Baseline:  Goal status: INITIAL  4.  Pt to report no more than 2/10 pain with penetration for improved tolerance to medical exams and intercourse  Baseline:  Goal status: INITIAL  5.  Pt to demonstrate at least 30 mins of midline upright posture without compensation for decreased strain at pelvic floor to better tolerate carrying baby.  Baseline:  Goal status: INITIAL  6.  Pt to demonstrate x10 single leg sit to stands with minimal sway for improved pelvic stability to better tolerate walking and carrying baby without pain.  Baseline:  Goal status: INITIAL  PLAN:  PT FREQUENCY: 1x/week  PT DURATION: 10 sessions  PLANNED INTERVENTIONS: 97110-Therapeutic exercises, 97530- Therapeutic activity, 97112- Neuromuscular  re-education, 97535- Self Care, 02859- Manual therapy, [phone]- Canalith repositioning, [id number]- Aquatic Therapy, [phone]- Electrical stimulation (manual), [phone] (1-2 muscles), [id number] (3+ muscles)- Dry Needling, Patient/Family education, Taping, Joint mobilization, Spinal mobilization, Scar mobilization, DME instructions, Cryotherapy, Moist heat, and Biofeedback  PLAN FOR NEXT SESSION: coordination of breathing and transverse abdominis activation, stretching hips and core, voiding mechanics, abdominal massage, core and hip strengthening, posture training    Darryle Navy, PT, DPT 09/23/252:59 PM  [facility] [address], [address] [place], [place] [address] Phone # [phone] Fax [phone]   PHYSICAL THERAPY DISCHARGE SUMMARY  Visits from Start of Care: 1  Current functional level related to goals / functional outcomes: Pt reports she is feeling better, requests DC  Remaining deficits: None per pt   Education / Equipment: HEP   Patient agrees to discharge. Patient goals were unsure, pt requests DC without returning . Patient is being discharged due to being pleased with the current functional level.  Darryle Navy, PT, DPT 02/25/2509:13 AM  [facility] [address] [place], [place] [address] Phone # [phone] Fax [phone]

## 2024-02-08 ENCOUNTER — Other Ambulatory Visit: Payer: Self-pay

## 2024-02-08 ENCOUNTER — Encounter: Payer: Self-pay | Admitting: Obstetrics and Gynecology

## 2024-02-08 ENCOUNTER — Ambulatory Visit: Admitting: Obstetrics and Gynecology

## 2024-02-08 VITALS — BP 120/74 | HR 89 | Wt 216.9 lb

## 2024-02-08 DIAGNOSIS — Z30432 Encounter for removal of intrauterine contraceptive device: Secondary | ICD-10-CM

## 2024-02-08 DIAGNOSIS — Z1331 Encounter for screening for depression: Secondary | ICD-10-CM | POA: Diagnosis not present

## 2024-02-08 DIAGNOSIS — Z3009 Encounter for other general counseling and advice on contraception: Secondary | ICD-10-CM

## 2024-02-08 MED ORDER — PHEXXI 1.8-1-0.4 % VA GEL: VAGINAL | 11 refills | Status: AC

## 2024-02-08 NOTE — Progress Notes (Signed)
 GYNECOLOGY VISIT  Patient name: Sally Shannon MRN [id number]  Date of birth: 1996-04-08 Chief Complaint:   Procedure  History:  Having continuous yellow discharge since insertion. Partner feeling something sharp during intercourse when she is on top. Offered shortned strings vs removal, would prefer removal. States did not have this issue with first IUD. Used nexplanon  when 28 yo for 1 year and after daughter had IUD x 5 years and then thought copper  IUD would work again. Does NOT want another pregnancy right now, not even sure about another pregnancy period.   The following portions of the patient's history were reviewed and updated as appropriate: allergies, current medications, past family history, past medical history, past social history, past surgical history and problem list.   Health Maintenance:   Last pap     Component Value Date/Time   DIAGPAP (A) 01/02/2024 1511    - Atypical squamous cells of undetermined significance (ASC-US )   DIAGPAP  08/26/2020 1346    - Negative for intraepithelial lesion or malignancy (NILM)   DIAGPAP  06/28/2016 0000    NEGATIVE FOR INTRAEPITHELIAL LESIONS OR MALIGNANCY.   HPVHIGH Negative 01/02/2024 1511   ADEQPAP  01/02/2024 1511    Satisfactory for evaluation; transformation zone component PRESENT.   ADEQPAP  08/26/2020 1346    Satisfactory for evaluation; transformation zone component PRESENT.   ADEQPAP  06/28/2016 0000    Satisfactory for evaluation  endocervical/transformation zone component PRESENT.    Health Maintenance  Topic Date Due   Complete foot exam   Never done   Eye exam for diabetics  Never done   Yearly kidney health urinalysis for diabetes  Never done   Pneumococcal Vaccine (1 of 2 - PCV) Never done   Hemoglobin A1C  11/15/2023   Flu Shot  12/01/2023   COVID-19 Vaccine (1 - 2025-26 season) Never done   Yearly kidney function blood test for diabetes  11/21/2024   DTaP/Tdap/Td vaccine (9 - Td or Tdap)  03/09/2026   Pap Smear  01/02/2027   Hepatitis B Vaccine  Completed   HPV Vaccine  Completed   Hepatitis C Screening  Completed   HIV Screening  Completed   Meningitis B Vaccine  Aged Out      Review of Systems:  Pertinent items are noted in HPI. Comprehensive review of systems was otherwise negative.   Objective:  Physical Exam BP 120/74   Pulse 89   Wt 216 lb 14.4 oz (98.4 kg)   BMI 37.23 kg/m    Physical Exam Vitals and nursing note reviewed. Exam conducted with a chaperone present.  Constitutional:      Appearance: Normal appearance.  HENT:     Head: Normocephalic and atraumatic.  Pulmonary:     Effort: Pulmonary effort is normal.  Genitourinary:    General: Normal vulva.     Vagina: Normal.     Cervix: Discharge present.     Comments: Thin yellow discharge noted Skin:    General: Skin is warm and dry.  Neurological:     General: No focal deficit present.     Mental Status: She is alert.  Psychiatric:        Mood and Affect: Mood normal.        Behavior: Behavior normal.        Thought Content: Thought content normal.        Judgment: Judgment normal.     IUD Removal  Patient identified, informed consent performed, consent signed.  Patient  was in the dorsal lithotomy position, normal external genitalia was noted.  A speculum was placed in the patient's vagina, normal discharge was noted, no lesions. The cervix was visualized, no lesions, no abnormal discharge.  The strings of the IUD were visualized, grasped and pulled using ring forceps. The IUD was removed in its entirety. . Patient tolerated the procedure well.       Assessment & Plan:  1. Encounter for IUD removal (Primary) Patient will use phexxi for contraception. Routine preventative health maintenance measures emphasized.  2. Encounter for counseling regarding contraception Reviewed all forms of birth control options available including abstinence; over the counter/barrier methods; hormonal  contraceptive medication including pill, patch, ring, injection,contraceptive implant; hormonal and nonhormonal IUDs; permanent sterilization options including vasectomy and the various tubal sterilization modalities. Risks and benefits reviewed.  Questions were answered.  Information was given to patient to review.   - Lactic Ac-Citric Ac-Pot Bitart (PHEXXI) 1.8-1-0.4 % GEL; Administer 1 applicator (5 g) intravaginally immediately before or up to 1 hour before EACH act of vaginal intercourse as needed.  Dispense: 60 g; Refill: 11   Carter Quarry, MD [facility], [facility]

## 2024-02-26 ENCOUNTER — Ambulatory Visit: Admitting: Physical Therapy

## 2024-03-04 ENCOUNTER — Encounter: Admitting: Physical Therapy

## 2024-03-11 ENCOUNTER — Encounter: Admitting: Physical Therapy

## 2024-03-18 ENCOUNTER — Encounter: Admitting: Physical Therapy

## 2024-03-25 ENCOUNTER — Encounter: Admitting: Physical Therapy

## 2024-04-01 ENCOUNTER — Encounter: Admitting: Physical Therapy

## 2024-04-08 ENCOUNTER — Encounter: Admitting: Physical Therapy
